# Patient Record
Sex: Female | Born: 1960 | Race: White | Hispanic: No | Marital: Married | Smoking: Never smoker
Health system: Southern US, Community
[De-identification: ages and names within clinical notes are randomized; demographics above are authoritative.]

## PROBLEM LIST (undated history)

## (undated) DIAGNOSIS — Z9889 Other specified postprocedural states: Secondary | ICD-10-CM

## (undated) DIAGNOSIS — C50919 Malignant neoplasm of unspecified site of unspecified female breast: Secondary | ICD-10-CM

## (undated) DIAGNOSIS — E785 Hyperlipidemia, unspecified: Secondary | ICD-10-CM

## (undated) DIAGNOSIS — R112 Nausea with vomiting, unspecified: Secondary | ICD-10-CM

## (undated) DIAGNOSIS — I10 Essential (primary) hypertension: Secondary | ICD-10-CM

## (undated) DIAGNOSIS — D0500 Lobular carcinoma in situ of unspecified breast: Secondary | ICD-10-CM

## (undated) DIAGNOSIS — N6099 Unspecified benign mammary dysplasia of unspecified breast: Secondary | ICD-10-CM

## (undated) DIAGNOSIS — K635 Polyp of colon: Secondary | ICD-10-CM

## (undated) DIAGNOSIS — F419 Anxiety disorder, unspecified: Secondary | ICD-10-CM

## (undated) HISTORY — DX: Unspecified benign mammary dysplasia of unspecified breast: N60.99

## (undated) HISTORY — DX: Essential (primary) hypertension: I10

## (undated) HISTORY — PX: BREAST BIOPSY: SHX20

## (undated) HISTORY — PX: DILATION AND CURETTAGE OF UTERUS: SHX78

## (undated) HISTORY — PX: COLONOSCOPY W/ BIOPSIES AND POLYPECTOMY: SHX1376

## (undated) HISTORY — DX: Polyp of colon: K63.5

## (undated) HISTORY — DX: Hyperlipidemia, unspecified: E78.5

---

## 1987-09-29 HISTORY — PX: TUBAL LIGATION: SHX77

## 1992-09-28 HISTORY — PX: ABDOMINAL HYSTERECTOMY: SHX81

## 1998-09-28 HISTORY — PX: PAROTID GLAND TUMOR EXCISION: SHX5221

## 2006-09-28 HISTORY — PX: BREAST LUMPECTOMY: SHX2

## 2006-09-28 HISTORY — PX: BREAST BIOPSY: SHX20

## 2007-04-04 ENCOUNTER — Ambulatory Visit: Payer: Self-pay | Admitting: Family Medicine

## 2007-04-04 DIAGNOSIS — I10 Essential (primary) hypertension: Secondary | ICD-10-CM

## 2007-04-18 ENCOUNTER — Ambulatory Visit: Payer: Self-pay | Admitting: Family Medicine

## 2007-04-19 ENCOUNTER — Telehealth (INDEPENDENT_AMBULATORY_CARE_PROVIDER_SITE_OTHER): Payer: Self-pay | Admitting: *Deleted

## 2007-05-05 ENCOUNTER — Ambulatory Visit: Payer: Self-pay | Admitting: Family Medicine

## 2007-05-06 LAB — CONVERTED CEMR LAB
BUN: 12 mg/dL (ref 6–23)
Calcium: 9.6 mg/dL (ref 8.4–10.5)
Creatinine, Ser: 0.7 mg/dL (ref 0.4–1.2)
Sodium: 140 meq/L (ref 135–145)

## 2007-05-24 ENCOUNTER — Encounter: Payer: Self-pay | Admitting: Family Medicine

## 2007-06-10 ENCOUNTER — Encounter: Payer: Self-pay | Admitting: Family Medicine

## 2007-06-10 ENCOUNTER — Encounter: Admission: RE | Admit: 2007-06-10 | Discharge: 2007-06-10 | Payer: Self-pay | Admitting: Obstetrics and Gynecology

## 2007-06-29 ENCOUNTER — Encounter: Admission: RE | Admit: 2007-06-29 | Discharge: 2007-06-29 | Payer: Self-pay | Admitting: Obstetrics and Gynecology

## 2007-06-29 ENCOUNTER — Encounter: Payer: Self-pay | Admitting: Family Medicine

## 2007-06-29 ENCOUNTER — Encounter (INDEPENDENT_AMBULATORY_CARE_PROVIDER_SITE_OTHER): Payer: Self-pay | Admitting: Diagnostic Radiology

## 2007-08-05 ENCOUNTER — Ambulatory Visit: Payer: Self-pay | Admitting: Family Medicine

## 2007-08-05 DIAGNOSIS — E785 Hyperlipidemia, unspecified: Secondary | ICD-10-CM | POA: Insufficient documentation

## 2007-08-11 ENCOUNTER — Encounter (INDEPENDENT_AMBULATORY_CARE_PROVIDER_SITE_OTHER): Payer: Self-pay | Admitting: General Surgery

## 2007-08-11 ENCOUNTER — Encounter: Admission: RE | Admit: 2007-08-11 | Discharge: 2007-08-11 | Payer: Self-pay | Admitting: General Surgery

## 2007-08-11 ENCOUNTER — Ambulatory Visit (HOSPITAL_BASED_OUTPATIENT_CLINIC_OR_DEPARTMENT_OTHER): Admission: RE | Admit: 2007-08-11 | Discharge: 2007-08-11 | Payer: Self-pay | Admitting: General Surgery

## 2007-11-01 ENCOUNTER — Ambulatory Visit: Payer: Self-pay | Admitting: Family Medicine

## 2007-11-07 ENCOUNTER — Emergency Department (HOSPITAL_COMMUNITY): Admission: EM | Admit: 2007-11-07 | Discharge: 2007-11-07 | Payer: Self-pay | Admitting: Family Medicine

## 2007-11-07 ENCOUNTER — Telehealth (INDEPENDENT_AMBULATORY_CARE_PROVIDER_SITE_OTHER): Payer: Self-pay | Admitting: *Deleted

## 2007-11-07 LAB — CONVERTED CEMR LAB
ALT: 21 units/L (ref 0–35)
AST: 21 units/L (ref 0–37)
BUN: 15 mg/dL (ref 6–23)
CO2: 29 meq/L (ref 19–32)
Calcium: 9.3 mg/dL (ref 8.4–10.5)
Creatinine, Ser: 0.9 mg/dL (ref 0.4–1.2)
GFR calc non Af Amer: 71 mL/min
Potassium: 3.4 meq/L — ABNORMAL LOW (ref 3.5–5.1)
Sodium: 141 meq/L (ref 135–145)
Total CHOL/HDL Ratio: 4.9
Total Protein: 7.1 g/dL (ref 6.0–8.3)
Triglycerides: 65 mg/dL (ref 0–149)

## 2007-11-17 ENCOUNTER — Ambulatory Visit: Payer: Self-pay | Admitting: Family Medicine

## 2007-11-17 DIAGNOSIS — S60459A Superficial foreign body of unspecified finger, initial encounter: Secondary | ICD-10-CM | POA: Insufficient documentation

## 2008-02-01 ENCOUNTER — Encounter: Payer: Self-pay | Admitting: Family Medicine

## 2008-02-06 ENCOUNTER — Ambulatory Visit: Payer: Self-pay | Admitting: Family Medicine

## 2008-02-12 LAB — CONVERTED CEMR LAB
AST: 22 units/L (ref 0–37)
Albumin: 3.8 g/dL (ref 3.5–5.2)
Alkaline Phosphatase: 54 units/L (ref 39–117)
Cholesterol: 191 mg/dL (ref 0–200)
HDL: 37.5 mg/dL — ABNORMAL LOW (ref >39.0)
LDL Cholesterol: 142 mg/dL — ABNORMAL HIGH (ref 0–99)
Total Bilirubin: 0.6 mg/dL (ref 0.3–1.2)
Total CHOL/HDL Ratio: 5.1
Triglycerides: 60 mg/dL (ref 0–149)
VLDL: 12 mg/dL (ref 0–40)

## 2008-02-13 ENCOUNTER — Encounter (INDEPENDENT_AMBULATORY_CARE_PROVIDER_SITE_OTHER): Payer: Self-pay | Admitting: *Deleted

## 2008-03-01 ENCOUNTER — Telehealth (INDEPENDENT_AMBULATORY_CARE_PROVIDER_SITE_OTHER): Payer: Self-pay | Admitting: *Deleted

## 2008-04-04 ENCOUNTER — Encounter: Admission: RE | Admit: 2008-04-04 | Discharge: 2008-04-04 | Payer: Self-pay | Admitting: Obstetrics and Gynecology

## 2008-04-06 ENCOUNTER — Ambulatory Visit: Payer: Self-pay | Admitting: Family Medicine

## 2008-06-12 ENCOUNTER — Ambulatory Visit: Payer: Self-pay | Admitting: Family Medicine

## 2008-06-13 LAB — CONVERTED CEMR LAB
AST: 22 units/L (ref 0–37)
Albumin: 4 g/dL (ref 3.5–5.2)
Alkaline Phosphatase: 48 units/L (ref 39–117)
Bilirubin, Direct: 0.2 mg/dL (ref 0.0–0.3)
Chloride: 110 meq/L (ref 96–112)
Creatinine, Ser: 0.8 mg/dL (ref 0.4–1.2)
GFR calc Af Amer: 98 mL/min
GFR calc non Af Amer: 81 mL/min
Total Bilirubin: 0.9 mg/dL (ref 0.3–1.2)

## 2008-06-14 ENCOUNTER — Encounter (INDEPENDENT_AMBULATORY_CARE_PROVIDER_SITE_OTHER): Payer: Self-pay | Admitting: *Deleted

## 2008-06-25 ENCOUNTER — Telehealth (INDEPENDENT_AMBULATORY_CARE_PROVIDER_SITE_OTHER): Payer: Self-pay | Admitting: *Deleted

## 2008-07-03 ENCOUNTER — Ambulatory Visit: Payer: Self-pay | Admitting: Family Medicine

## 2008-11-20 ENCOUNTER — Ambulatory Visit: Payer: Self-pay | Admitting: Family Medicine

## 2008-11-21 LAB — CONVERTED CEMR LAB
ALT: 27 units/L (ref 0–35)
AST: 26 units/L (ref 0–37)
Albumin: 3.7 g/dL (ref 3.5–5.2)
Alkaline Phosphatase: 44 units/L (ref 39–117)
Bilirubin, Direct: 0.1 mg/dL (ref 0.0–0.3)
Cholesterol: 145 mg/dL (ref 0–200)
HDL: 45.6 mg/dL (ref >39.0)
LDL Cholesterol: 87 mg/dL (ref 0–99)
Total Bilirubin: 0.6 mg/dL (ref 0.3–1.2)
Total CHOL/HDL Ratio: 3.2
Total Protein: 7.2 g/dL (ref 6.0–8.3)
Triglycerides: 63 mg/dL (ref 0–149)
VLDL: 13 mg/dL (ref 0–40)

## 2008-11-22 ENCOUNTER — Encounter (INDEPENDENT_AMBULATORY_CARE_PROVIDER_SITE_OTHER): Payer: Self-pay | Admitting: *Deleted

## 2009-04-09 ENCOUNTER — Encounter: Admission: RE | Admit: 2009-04-09 | Discharge: 2009-04-09 | Payer: Self-pay | Admitting: Obstetrics and Gynecology

## 2009-07-04 ENCOUNTER — Ambulatory Visit: Payer: Self-pay | Admitting: Family Medicine

## 2009-07-09 ENCOUNTER — Telehealth: Payer: Self-pay | Admitting: Family Medicine

## 2009-07-09 LAB — CONVERTED CEMR LAB
ALT: 37 U/L — ABNORMAL HIGH
AST: 32 U/L
Albumin: 3.9 g/dL
Alkaline Phosphatase: 42 U/L
Bilirubin, Direct: 0 mg/dL
Cholesterol: 173 mg/dL
HDL: 43.1 mg/dL
LDL Cholesterol: 110 mg/dL — ABNORMAL HIGH
Total Bilirubin: 0.6 mg/dL
Total CHOL/HDL Ratio: 4
Total Protein: 7.4 g/dL
Triglycerides: 102 mg/dL
VLDL: 20.4 mg/dL

## 2009-07-22 ENCOUNTER — Ambulatory Visit: Payer: Self-pay | Admitting: Family Medicine

## 2009-07-22 DIAGNOSIS — IMO0001 Reserved for inherently not codable concepts without codable children: Secondary | ICD-10-CM

## 2009-07-22 DIAGNOSIS — F4320 Adjustment disorder, unspecified: Secondary | ICD-10-CM | POA: Insufficient documentation

## 2009-07-22 DIAGNOSIS — D1800 Hemangioma unspecified site: Secondary | ICD-10-CM | POA: Insufficient documentation

## 2009-07-23 ENCOUNTER — Ambulatory Visit: Payer: Self-pay | Admitting: Family Medicine

## 2009-07-23 ENCOUNTER — Telehealth (INDEPENDENT_AMBULATORY_CARE_PROVIDER_SITE_OTHER): Payer: Self-pay | Admitting: *Deleted

## 2009-11-08 ENCOUNTER — Telehealth (INDEPENDENT_AMBULATORY_CARE_PROVIDER_SITE_OTHER): Payer: Self-pay | Admitting: *Deleted

## 2009-12-27 ENCOUNTER — Ambulatory Visit: Payer: Self-pay | Admitting: Family

## 2009-12-27 DIAGNOSIS — R05 Cough: Secondary | ICD-10-CM

## 2009-12-27 DIAGNOSIS — J40 Bronchitis, not specified as acute or chronic: Secondary | ICD-10-CM | POA: Insufficient documentation

## 2009-12-30 ENCOUNTER — Telehealth: Payer: Self-pay | Admitting: Family

## 2009-12-30 ENCOUNTER — Ambulatory Visit: Payer: Self-pay | Admitting: Diagnostic Radiology

## 2009-12-30 ENCOUNTER — Ambulatory Visit (HOSPITAL_BASED_OUTPATIENT_CLINIC_OR_DEPARTMENT_OTHER): Admission: RE | Admit: 2009-12-30 | Discharge: 2009-12-30 | Payer: Self-pay | Admitting: Internal Medicine

## 2009-12-30 ENCOUNTER — Ambulatory Visit: Payer: Self-pay | Admitting: Family

## 2010-03-28 ENCOUNTER — Telehealth (INDEPENDENT_AMBULATORY_CARE_PROVIDER_SITE_OTHER): Payer: Self-pay | Admitting: *Deleted

## 2010-04-03 ENCOUNTER — Telehealth (INDEPENDENT_AMBULATORY_CARE_PROVIDER_SITE_OTHER): Payer: Self-pay | Admitting: *Deleted

## 2010-04-10 ENCOUNTER — Encounter: Admission: RE | Admit: 2010-04-10 | Discharge: 2010-04-10 | Payer: Self-pay | Admitting: Family Medicine

## 2010-05-27 ENCOUNTER — Ambulatory Visit: Payer: Self-pay | Admitting: Family Medicine

## 2010-05-28 LAB — CONVERTED CEMR LAB
Albumin: 4.1 g/dL (ref 3.5–5.2)
Alkaline Phosphatase: 53 units/L (ref 39–117)
Total Protein: 7.2 g/dL (ref 6.0–8.3)

## 2010-06-13 ENCOUNTER — Ambulatory Visit: Payer: Self-pay | Admitting: Family Medicine

## 2010-09-05 ENCOUNTER — Encounter: Payer: Self-pay | Admitting: Family Medicine

## 2010-09-05 ENCOUNTER — Ambulatory Visit: Payer: Self-pay | Admitting: Family Medicine

## 2010-10-20 ENCOUNTER — Encounter: Payer: Self-pay | Admitting: Family Medicine

## 2010-10-29 NOTE — Progress Notes (Signed)
Summary: CRESTOR RX  Phone Note Call from Patient   Caller: Patient Summary of Call: PT STATES SHE WENT TO THE DRUG STORE YESTERDAY TO PICK UP HER RX FOR CRESTOR 5 MG. THEY ARE TRYING TO CHARGE HER FOR 2 MONTHS BUT ITS ONLY 30 PILLS.SHE STATES SHE UNDERSTANDS THAT SHE ONLY TAKES IT EVERY OTHER DAY BUT SHE BELIEVES SHE SHOULD ONLY PAY ONE CO-PAY. SHE DOESNT KNOW IF IT COULD BE CALLED IN DIFFERENTLY TO KEEP HER FROM HAVING TO PAY 2 CO-PAYS. PLEASE ADVISE. PT CAN BE REACHED AT [phone]. Initial call taken by: Lavell Islam,  April 03, 2010 9:20 AM  Follow-up for Phone Call        pt advise rx sent in with direction change but still need to take med every other day.Felecia Deloach CMA  April 03, 2010 10:45 AM     New/Updated Medications: CRESTOR 5 MG TABS (ROSUVASTATIN CALCIUM) Take 1 by mouth as directed Prescriptions: CRESTOR 5 MG TABS (ROSUVASTATIN CALCIUM) Take 1 by mouth as directed  #30 x 0   Entered by:   Jeremy Johann CMA   Authorized by:   Loreen Freud DO   Signed by:   Jeremy Johann CMA on 04/03/2010   Method used:   Re-Faxed to ...       [id number] (retail)       [address]       [place], [place]  [address]       Ph: [phone] or [phone]       Fax: [phone]   RxID:   [id number]

## 2010-10-29 NOTE — Progress Notes (Signed)
  Phone Note Refill Request Message from:  Pharmacy [facility]  [phone]  Refills Requested: Medication #1:  NORVASC 10 MG  TABS 1 by mouth once daily  Medication #2:  HYDROCHLOROTHIAZIDE 25 MG  TABS 1 by mouth once daily Initial call taken by: Kandice Hams,  November 08, 2009 2:34 PM    Prescriptions: HYDROCHLOROTHIAZIDE 25 MG  TABS (HYDROCHLOROTHIAZIDE) 1 by mouth once daily  #90 x 0   Entered by:   Kandice Hams   Authorized by:   Loreen Freud DO   Signed by:   Kandice Hams on 11/08/2009   Method used:   Faxed to ...       [facility]* (mail-order)             ,          Ph: [phone]       Fax: [phone]   RxID:   [id number] NORVASC 10 MG  TABS (AMLODIPINE BESYLATE) 1 by mouth once daily  #90 x 0   Entered by:   Kandice Hams   Authorized by:   Loreen Freud DO   Signed by:   Kandice Hams on 11/08/2009   Method used:   Faxed to ...       [facility]* (mail-order)             ,          Ph: [phone]       Fax: [phone]   RxID:   [id number]

## 2010-10-29 NOTE — Progress Notes (Signed)
Summary: letter mailed needs fasting lab appr  Phone Note Outgoing Call   Reason for Call: Discuss lab or test results Summary of Call: Pt needs to come in for lab appt:  NMR, hep  272.4  Follow-up for Phone Call        letter mailed to patient to schedule fasting lab appt .Marland KitchenOkey Regal Spring  April 04, 2010 3:14 PM

## 2010-10-29 NOTE — Progress Notes (Signed)
Summary: SEEN FRIDAY NO BETTER--appt.  Phone Note Call from Patient Call back at Home Phone [phone]   Caller: Patient Summary of Call: pt called was seen friday 12/27/09 was told to call if no better. Pt says cough is no better, Tessalon Perles did not do anything, cough is worse in afternoon and at night.  Runny nose, has 1 more day of zpak,  broke out in sweat last night, denies fever today.  [facility]  Initial call taken by: Kandice Hams,  December 30, 2009 10:00 AM  Follow-up for Phone Call        Patient needs to be seen in office.  She will need a chest x-ray.  If Dr. Laury Axon cannot see her than I can see her in [place].  Thanks Follow-up by: Lemont Fillers FNP,  December 30, 2009 10:20 AM  Additional Follow-up for Phone Call Additional follow up Details #1::        Spoke  with pt. and scheduled follow up today at 1:15pm with Bravery Ketcham.  Mervin Kung CMA  December 30, 2009 10:41 AM

## 2010-10-29 NOTE — Assessment & Plan Note (Signed)
Summary: COUGH NOT BETTER/ TF,CMA   Vital Signs:  Patient profile:   50 year old female Height:      65.5 inches Weight:      165 pounds BMI:     27.14 O2 Sat:      98 % on Room air Temp:     97.9 degrees F oral Pulse rate:   84 / minute Pulse rhythm:   regular Resp:     16 per minute BP sitting:   110 / 86  (right arm) Cuff size:   regular  Vitals Entered By: Mervin Kung CMA (December 30, 2009 1:18 PM)  O2 Flow:  Room air CC: room 4   Cough not better. Feels worse.    Primary Care Provider:  lowne  CC:  room 4   Cough not better. Feels worse. Marland Kitchen  History of Present Illness: Kathy Watkins is a 50 year old female who presents today in follow up of her bronchitis.  She was seen 4/1 and prescribed zithromax and tessalon.  Notes that she has had some chills, cough is worse despite tessalon and has been associated with some stress incontinence.  Yesterday she started to develop some clear nasal congestion,  cough is generally dry.  Denies fever, sore throat or ear pain.  She has one more day left of her zithromax.   Allergies (verified): No Known Drug Allergies  Physical Exam  General:  Well-developed,well-nourished,in no acute distress; alert,appropriate and cooperative throughout examination.  Notes to have hacking cough frequently throughout examination/interview Head:  Normocephalic and atraumatic without obvious abnormalities. No apparent alopecia or balding. Ears:  External ear exam shows no significant lesions or deformities.  Otoscopic examination reveals clear canals, tympanic membranes are intact bilaterally without bulging, retraction, inflammation or discharge. Hearing is grossly normal bilaterally. Mouth:  Oral mucosa and oropharynx without lesions or exudates.  Teeth in good repair. Neck:  No deformities, masses, or tenderness noted. Lungs:  Normal respiratory effort, chest expands symmetrically. Lungs are clear to auscultation, no crackles or wheezes. Heart:  Normal  rate and regular rhythm. S1 and S2 normal without gallop, murmur, click, rub or other extra sounds.   Impression & Recommendations:  Problem # 1:  BRONCHITIS, MILD (ICD-490) CXR performed today and is negative. Patient instructed to complete zithromax. Will give patient a trial of Advair for possible Reactive airway disease.  Pt also given prescription for tussionex to be used at bedtime.  Plan follow up in 1-2 weeks.  pt given samples of advair lot [id number] exp 03/2011 Her updated medication list for this problem includes:    Zithromax Z-pak 250 Mg Tabs (Azithromycin) .Marland Kitchen... 2 tabs by mouth today, then one tablet by mouth daily x 4 more days    Tessalon Perles 100 Mg Caps (Benzonatate) ..... One cap by mouth three times a day as needed cough    Tussionex Pennkinetic Er 8-10 Mg/78ml Lqcr (Chlorpheniramine-hydrocodone) .Marland KitchenMarland KitchenMarland KitchenMarland Kitchen 5 ml by mouth every 12 hours as needed for cough  Orders: CXR- 2view (CXR)  Complete Medication List: 1)  Norvasc 10 Mg Tabs (Amlodipine besylate) .Marland Kitchen.. 1 by mouth once daily 2)  Hydrochlorothiazide 25 Mg Tabs (Hydrochlorothiazide) .Marland Kitchen.. 1 by mouth once daily 3)  Alprazolam 0.25 Mg Tabs (Alprazolam) .Marland Kitchen.. 1 by mouth three times a day as needed 4)  Crestor 5 Mg Tabs (Rosuvastatin calcium) .Marland Kitchen.. 1 by mouth every other day. 5)  Fish Oil 1000 Mg Caps (Omega-3 fatty acids) .... Take 1 capsule by mouth two times a day  6)  Daily Multi Tabs (Multiple vitamins-minerals) .... Take 1 tablet by mouth once a day 7)  Zithromax Z-pak 250 Mg Tabs (Azithromycin) .... 2 tabs by mouth today, then one tablet by mouth daily x 4 more days 8)  Tessalon Perles 100 Mg Caps (Benzonatate) .... One cap by mouth three times a day as needed cough 9)  Tussionex Pennkinetic Er 8-10 Mg/71ml Lqcr (Chlorpheniramine-hydrocodone) .... 5 ml by mouth every 12 hours as needed for cough  Patient Instructions: 1)  Please follow up in 1-2 weeks, call if symptoms worsen or do not improve. Prescriptions: TUSSIONEX  PENNKINETIC ER 8-10 MG/5ML LQCR (CHLORPHENIRAMINE-HYDROCODONE) 5 ml by mouth every 12 hours as needed for cough  #150 x 0   Entered and Authorized by:   Lemont Fillers FNP   Signed by:   Lemont Fillers FNP on 12/30/2009   Method used:   Print then Give to Patient   RxID:   [id number]   Current Allergies (reviewed today): No known allergies

## 2010-10-29 NOTE — Assessment & Plan Note (Signed)
Summary: "barking" cough//lch   Vital Signs:  Patient profile:   50 year old female Height:      65.5 inches Weight:      168 pounds O2 Sat:      97 % on Room air Temp:     98.9 degrees F oral Pulse rate:   74 / minute Pulse rhythm:   regular Resp:     18 per minute BP sitting:   136 / 78  (left arm) Cuff size:   regular  Vitals Entered By: Mervin Kung CMA (December 27, 2009 9:36 AM)  O2 Flow:  Room air CC: room 17  Cough x 2 days.   Primary Care Provider:  lowne  CC:  room 17  Cough x 2 days.Marland Kitchen  History of Present Illness: Kathy Watkins is a 50 year old female who presents with c/o 2 day history of dry cough.   Cough is worse with movement.  Has not used and OTC meds.  Denies nasal congestion,fever, ear pain, or sore throat.    Allergies (verified): No Known Drug Allergies  Physical Exam  General:  Well-developed,well-nourished,in no acute distress; alert,appropriate and cooperative throughout examination Eyes:  PERRLA Ears:  External ear exam shows no significant lesions or deformities.  Otoscopic examination reveals clear canals, tympanic membranes are intact bilaterally without bulging, retraction, inflammation or discharge. Hearing is grossly normal bilaterally. Lungs:  Normal respiratory effort, chest expands symmetrically. Lungs are clear to auscultation, no crackles or wheezes. Heart:  Normal rate and regular rhythm. S1 and S2 normal without gallop, murmur, click, rub or other extra sounds.   Impression & Recommendations:  Problem # 1:  BRONCHITIS, MILD (ICD-490) Assessment New Will treat with z-pak and tessalon as needed.  Pt instructed to f/u as listed on pt instructions Her updated medication list for this problem includes:    Zithromax Z-pak 250 Mg Tabs (Azithromycin) .Marland Kitchen... 2 tabs by mouth today, then one tablet by mouth daily x 4 more days    Tessalon Perles 100 Mg Caps (Benzonatate) ..... One cap by mouth three times a day as needed cough  Complete  Medication List: 1)  Norvasc 10 Mg Tabs (Amlodipine besylate) .Marland Kitchen.. 1 by mouth once daily 2)  Hydrochlorothiazide 25 Mg Tabs (Hydrochlorothiazide) .Marland Kitchen.. 1 by mouth once daily 3)  Alprazolam 0.25 Mg Tabs (Alprazolam) .Marland Kitchen.. 1 by mouth three times a day as needed 4)  Crestor 5 Mg Tabs (Rosuvastatin calcium) .Marland Kitchen.. 1 by mouth every other day. 5)  Fish Oil 1000 Mg Caps (Omega-3 fatty acids) .... Take 1 capsule by mouth two times a day 6)  Daily Multi Tabs (Multiple vitamins-minerals) .... Take 1 tablet by mouth once a day 7)  Zithromax Z-pak 250 Mg Tabs (Azithromycin) .... 2 tabs by mouth today, then one tablet by mouth daily x 4 more days 8)  Tessalon Perles 100 Mg Caps (Benzonatate) .... One cap by mouth three times a day as needed cough  Patient Instructions: 1)  Call if fever, symptoms worsen, or if your symptoms do not improve.  Prescriptions: TESSALON PERLES 100 MG CAPS (BENZONATATE) one cap by mouth three times a day as needed cough  #30 x 0   Entered and Authorized by:   Lemont Fillers FNP   Signed by:   Lemont Fillers FNP on 12/27/2009   Method used:   Electronically to        [id number]       [address]  [place], [place]  [address]       Ph: [phone] or [phone]       Fax: [phone]   RxID:   [id number] ZITHROMAX Z-PAK 250 MG TABS (AZITHROMYCIN) 2 tabs by mouth today, then one tablet by mouth daily x 4 more days  #1 pack x 0   Entered and Authorized by:   Lemont Fillers FNP   Signed by:   Lemont Fillers FNP on 12/27/2009   Method used:   Electronically to        [id number] ([facility]ail)       [address]       [place], [place]  [address]       Ph: [phone] or [phone]       Fax: [phone]   RxID:   [id number]   Current Allergies (reviewed today): No known allergies

## 2010-10-29 NOTE — Assessment & Plan Note (Signed)
Summary: follow up labs/cbs   Vital Signs:  Patient profile:   50 year old female Weight:      158.8 pounds Temp:     98.9 degrees F oral Pulse rate:   84 / minute Pulse rhythm:   regular BP sitting:   116 / 76  (left arm)  Vitals Entered By: Almeta Monas CMA Duncan Dull) (June 13, 2010 8:23 AM)   Flu Vaccine Consent Questions     Do you have a history of severe allergic reactions to this vaccine? no    Any prior history of allergic reactions to egg and/or gelatin? no    Do you have a sensitivity to the preservative Thimersol? no    Do you have a past history of Guillan-Barre Syndrome? no    Do you currently have an acute febrile illness? no    Have you ever had a severe reaction to latex? no    Vaccine information given and explained to patient? yes    Are you currently pregnant? no    Lot Number:[id number]   Exp Date:03/28/2011   Site Given  Left Deltoid IM                                CC: pt here to discuss labs   History of Present Illness: Pt is here to review labs only.  No complaints.  Current Medications (verified): 1)  Norvasc 10 Mg  Tabs (Amlodipine Besylate) .Marland Kitchen.. 1 By Mouth Once Daily 2)  Hydrochlorothiazide 25 Mg  Tabs (Hydrochlorothiazide) .Marland Kitchen.. 1 By Mouth Once Daily 3)  Alprazolam 0.25 Mg Tabs (Alprazolam) .Marland Kitchen.. 1 By Mouth Three Times A Day As Needed 4)  Crestor 5 Mg Tabs (Rosuvastatin Calcium) .... Take 1 By Mouth As Directed 5)  Fish Oil 1000 Mg Caps (Omega-3 Fatty Acids) .... Take 2 Capsules By Mouth Two Times A Day 6)  Daily Multi  Tabs (Multiple Vitamins-Minerals) .... Take 1 Tablet By Mouth Once A Day  Allergies (verified): No Known Drug Allergies  Past History:  Past medical, surgical, family and social histories (including risk factors) reviewed for relevance to current acute and chronic problems.  Past Medical History: Reviewed history from 08/05/2007 and no changes required. Hypertension Hyperlipidemia  Past  Surgical History: Reviewed history from 04/04/2007 and no changes required. Hysterectomy-1994 (STILL WITH OVARIES)  Family History: Reviewed history from 04/04/2007 and no changes required. Family History of Prostate CA 1st degree relative <50-FATHER (LIVING) Family History Breast cancer 1st degree relative <50-MOTHER (DECEASE) OTHER CANCER-MGM, PGM Family History Hypertension-M,F  1 SIBLING-SISTER (LIVING)  Social History: Reviewed history from 04/04/2007 and no changes required. Occupation: Librarian, academic Married Never Smoked Alcohol use-yes Drug use-no Regular exercise-yes  Review of Systems      See HPI  Physical Exam  General:  Well-developed,well-nourished,in no acute distress; alert,appropriate and cooperative throughout examination Psych:  Cognition and judgment appear intact. Alert and cooperative with normal attention span and concentration. No apparent delusions, illusions, hallucinations   Impression & Recommendations:  Problem # 1:  HYPERLIPIDEMIA (ICD-272.4) NMR reviewed with pt increase fish oil to 1000mg   2 by mouth two times a day  Her updated medication list for this problem includes:    Crestor 5 Mg Tabs (Rosuvastatin calcium) .Marland Kitchen... Take 1 by mouth as directed  Labs Reviewed: SGOT: 20 (05/27/2010)   SGPT: 20 (05/27/2010)   HDL:43.10 (07/04/2009), 45.6 (11/20/2008)  LDL:110 (07/04/2009), 87 (16/06/9603)  Chol:173 (07/04/2009), 145 (11/20/2008)  Trig:102.0 (07/04/2009), 63 (11/20/2008)  Complete Medication List: 1)  Norvasc 10 Mg Tabs (Amlodipine besylate) .Marland Kitchen.. 1 by mouth once daily 2)  Hydrochlorothiazide 25 Mg Tabs (Hydrochlorothiazide) .Marland Kitchen.. 1 by mouth once daily 3)  Alprazolam 0.25 Mg Tabs (Alprazolam) .Marland Kitchen.. 1 by mouth three times a day as needed 4)  Crestor 5 Mg Tabs (Rosuvastatin calcium) .... Take 1 by mouth as directed 5)  Fish Oil 1000 Mg Caps (Omega-3 fatty acids) .... Take 2 capsules by mouth two times a day 6)  Daily Multi Tabs (Multiple  vitamins-minerals) .... Take 1 tablet by mouth once a day  Other Orders: Admin 1st Vaccine ([address]6440) Flu Vaccine 3yrs + ([address]42)  Patient Instructions: 1)  fasting labs 3 months-----272.4  NMR, hep

## 2010-10-30 LAB — CONVERTED CEMR LAB
ALT: 18 units/L (ref 0–35)
AST: 21 units/L (ref 0–37)
Alkaline Phosphatase: 59 units/L (ref 39–117)
Bilirubin, Direct: 0.1 mg/dL (ref 0.0–0.3)
Total Protein: 7.1 g/dL (ref 6.0–8.3)

## 2010-11-05 NOTE — Procedures (Signed)
Summary: Colonoscopy/[facility]  Colonoscopy/[facility]   Imported By: Lanelle Bal 10/29/2010 11:08:33  _____________________________________________________________________  External Attachment:    Type:   Image     Comment:   External Document

## 2010-12-02 ENCOUNTER — Telehealth: Payer: Self-pay | Admitting: Family Medicine

## 2010-12-09 NOTE — Progress Notes (Signed)
Summary: refill  Phone Note Refill Request Message from:  Fax from Pharmacy on December 02, 2010 2:07 PM  Refills Requested: Medication #1:  ALPRAZOLAM 0.25 MG TABS 1 by mouth three times a day as needed   [facility]: [facility] [address] [facility] [id number]  Initial call taken by: Okey Regal Spring,  December 02, 2010 2:08 PM  Follow-up for Phone Call        last seen 06/07/10 and filled 07/22/09. please advise Follow-up by: Almeta Monas CMA Duncan Dull),  December 02, 2010 2:54 PM  Additional Follow-up for Phone Call Additional follow up Details #1::        refill x1 Additional Follow-up by: Loreen Freud DO,  December 02, 2010 3:49 PM    Additional Follow-up for Phone Call Additional follow up Details #2::    [facility] [address]... Almeta Monas CMA Duncan Dull)  December 02, 2010 3:56 PM   Prescriptions: ALPRAZOLAM 0.25 MG TABS (ALPRAZOLAM) 1 by mouth three times a day as needed  #60 x 0   Entered by:   Almeta Monas CMA (AAMA)   Authorized by:   Loreen Freud DO   Signed by:   Almeta Monas CMA (AAMA) on 12/02/2010   Method used:   Printed then faxed to ...       [facility]* (retail)             ,          Ph: [phone]       Fax: [phone]   RxID:   [id number]

## 2010-12-15 ENCOUNTER — Ambulatory Visit (INDEPENDENT_AMBULATORY_CARE_PROVIDER_SITE_OTHER): Payer: BC Managed Care – PPO | Admitting: Family Medicine

## 2010-12-15 ENCOUNTER — Encounter: Payer: Self-pay | Admitting: Family Medicine

## 2010-12-15 DIAGNOSIS — S058X9A Other injuries of unspecified eye and orbit, initial encounter: Secondary | ICD-10-CM

## 2010-12-25 NOTE — Assessment & Plan Note (Signed)
Summary: eye red/cbs   Vital Signs:  Patient profile:   50 year old female Height:      65.5 inches Weight:      165.0 pounds BMI:     27.14 Temp:     98.2 degrees F oral BP sitting:   126 / 74  (left arm) Cuff size:   regular  Vitals Entered By: Almeta Monas CMA Duncan Dull) (December 15, 2010 9:48 AM) CC: woke up this morning feeling like something was bothering her eyes, Hypertension Management   History of Present Illness: Pt here c/o fb sensation in both eyes.  She woke up that way this am.  + some mild nasal congestion.  Pt was working in yard over weekend.  Hypertension History:      Positive major cardiovascular risk factors include hyperlipidemia and hypertension.  Negative major cardiovascular risk factors include female age less than 53 years old, negative family history for ischemic heart disease, and non-tobacco-user status.     Current Medications (verified): 1)  Norvasc 10 Mg  Tabs (Amlodipine Besylate) .Marland Kitchen.. 1 By Mouth Once Daily 2)  Hydrochlorothiazide 25 Mg  Tabs (Hydrochlorothiazide) .Marland Kitchen.. 1 By Mouth Once Daily 3)  Alprazolam 0.25 Mg Tabs (Alprazolam) .Marland Kitchen.. 1 By Mouth Three Times A Day As Needed 4)  Crestor 5 Mg Tabs (Rosuvastatin Calcium) .... Take 1 By Mouth As Directed 5)  Fish Oil 1000 Mg Caps (Omega-3 Fatty Acids) .... Take 2 Capsules By Mouth Two Times A Day 6)  Vigamox 0.5 % Soln (Moxifloxacin Hcl) .Marland Kitchen.. 1 Gtt Three Times A Day Both Eyes  Allergies (verified): No Known Drug Allergies  Past History:  Past medical, surgical, family and social histories (including risk factors) reviewed for relevance to current acute and chronic problems.  Past Medical History: Reviewed history from 08/05/2007 and no changes required. Hypertension Hyperlipidemia  Past Surgical History: Reviewed history from 04/04/2007 and no changes required. Hysterectomy-1994 (STILL WITH OVARIES)  Family History: Reviewed history from 04/04/2007 and no changes required. Family History of  Prostate CA 1st degree relative <50-FATHER (LIVING) Family History Breast cancer 1st degree relative <50-MOTHER (DECEASE) OTHER CANCER-MGM, PGM Family History Hypertension-M,F  1 SIBLING-SISTER (LIVING)  Social History: Reviewed history from 04/04/2007 and no changes required. 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     Impression & Recommendations:  Problem # 1:  CORNEAL ABRASION (ICD-918.1) Assessment New vigamox for 7 days  cool compresses  Complete Medication List: 1)  Norvasc 10 Mg Tabs (Amlodipine besylate) .Marland Kitchen.. 1 by mouth once daily 2)  Hydrochlorothiazide 25 Mg Tabs (Hydrochlorothiazide) .Marland Kitchen.. 1 by mouth once daily 3)  Alprazolam 0.25 Mg Tabs (Alprazolam) .Marland Kitchen.. 1 by mouth three times a day as needed 4)  Crestor 5 Mg Tabs (Rosuvastatin calcium) .... Take 1 by mouth as directed 5)  Fish Oil 1000 Mg Caps (Omega-3 fatty acids) .... Take 2 capsules by mouth two times a day 6)  Vigamox 0.5 % Soln (Moxifloxacin hcl) .Marland Kitchen.. 1 gtt three times a day both eyes  Hypertension Assessment/Plan:      The patient's hypertensive risk group is category B: At least one risk factor (excluding diabetes) with no target organ  damage.  Her calculated 10 year risk of coronary heart disease is 9 %.  Today's blood pressure is 126/74.    Patient Instructions: 1)  use drops for 7 days 2)  if no better 2-3 days call Dr Hazle Quant 3)  cool compresses Prescriptions: VIGAMOX 0.5 % SOLN (MOXIFLOXACIN HCL) 1 gtt three times a day both eyes  #7 days x 0   Entered and Authorized by:   Loreen Freud DO    Signed by:   Loreen Freud DO on 12/15/2010   Method used:   Electronically to        [id number]       [address]       [place], [place]  [address]       Ph: [phone] or [phone]       Fax: [phone]   RxID:   [id number]    Orders Added: 1)  Est. Patient Level III [03474]

## 2011-01-12 ENCOUNTER — Other Ambulatory Visit: Payer: Self-pay

## 2011-02-10 NOTE — Op Note (Signed)
NAMETAELYR, JANTZ              ACCOUNT NO.:  [id number]   MEDICAL RECORD NO.:  [id number]          PATIENT TYPE:  AMB   LOCATION:  DSC                          FACILITY:  [facility]   PHYSICIAN:  Ollen Gross. Vernell Morgans, M.D. DATE OF BIRTH:  29-Oct-1960   DATE OF PROCEDURE:  08/11/2007  DATE OF DISCHARGE:                               OPERATIVE REPORT   PREOPERATIVE DIAGNOSIS:  Left breast atypical ductal hyperplasia.   POSTOPERATIVE DIAGNOSIS:  Left breast atypical ductal hyperplasia.   PROCEDURE PERFORMED:  Left breast needle localized lumpectomy.   SURGEON:  Ollen Gross. Vernell Morgans, M.D.   ANESTHESIA:  General via LMA.   DESCRIPTION OF PROCEDURE:  After informed consent was obtained, the  patient was brought to the operating room and placed in the supine  position on the operating table.  After adequate induction of general  anesthesia, the patient's left breast was prepped with Betadine and  draped in the usual sterile manner.  Earlier in the day the patient had  undergone a wire localization procedure and the wire was entering the  left breast medially, aimed toward the nipple.   A small transverse incision was made just medial to the wire.  This  incision was carried down through the skin and subcutaneous tissue  sharply with electrocautery.  Once into the breast tissue, the path of  the wire could be easily palpated.  A circular portion of breast tissue  around the path of the wire was excised sharply with the electrocautery.  Once it was removed, it was sent to radiology for a specimen radiograph  that showed the clip and the wire in the center of the tissue removed.  It was sent to pathology for further evaluation.  Hemostasis was  achieved using the Bovie electrocautery.  The wound was then infiltrated  with 0.25% Marcaine.  The deep layer of the wound was then closed with  interrupted 3-0 Vicryl stitches; and the skin was closed with a running  4-0 Monocryl subcuticular stitch.  A  Dermabond dressing was applied.  The patient tolerated the procedure well.  At the end of the case, all  needle, sponge and instrument counts were correct.  The patient was then  awakened and taken to recovery in stable condition.      Ollen Gross. Vernell Morgans, M.D.  Electronically Signed     PST/MEDQ  D:  08/11/2007  T:  08/11/2007  Job:  [id number]

## 2011-02-23 ENCOUNTER — Other Ambulatory Visit: Payer: Self-pay | Admitting: Family Medicine

## 2011-04-10 ENCOUNTER — Other Ambulatory Visit: Payer: Self-pay | Admitting: Obstetrics and Gynecology

## 2011-04-10 DIAGNOSIS — Z1231 Encounter for screening mammogram for malignant neoplasm of breast: Secondary | ICD-10-CM

## 2011-04-13 ENCOUNTER — Other Ambulatory Visit: Payer: Self-pay | Admitting: Family Medicine

## 2011-04-13 DIAGNOSIS — E785 Hyperlipidemia, unspecified: Secondary | ICD-10-CM

## 2011-04-14 ENCOUNTER — Other Ambulatory Visit (INDEPENDENT_AMBULATORY_CARE_PROVIDER_SITE_OTHER): Payer: BC Managed Care – PPO

## 2011-04-14 DIAGNOSIS — E785 Hyperlipidemia, unspecified: Secondary | ICD-10-CM

## 2011-04-14 LAB — LIPID PANEL
Cholesterol: 192 mg/dL (ref 0–200)
HDL: 57.4 mg/dL (ref >39.00)
Triglycerides: 71 mg/dL (ref 0.0–149.0)
VLDL: 14.2 mg/dL (ref 0.0–40.0)

## 2011-04-14 LAB — HEPATIC FUNCTION PANEL
ALT: 26 U/L (ref 0–35)
Albumin: 4.1 g/dL (ref 3.5–5.2)
Total Protein: 7.3 g/dL (ref 6.0–8.3)

## 2011-04-14 NOTE — Progress Notes (Signed)
Labs only

## 2011-04-17 ENCOUNTER — Other Ambulatory Visit: Payer: Self-pay | Admitting: Family Medicine

## 2011-04-17 MED ORDER — HYDROCHLOROTHIAZIDE 25 MG PO TABS: 25.0000 mg | ORAL_TABLET | Freq: Every day | ORAL | Status: DC

## 2011-04-17 MED ORDER — AMLODIPINE BESYLATE 10 MG PO TABS: 10.0000 mg | ORAL_TABLET | Freq: Every day | ORAL | Status: DC

## 2011-04-22 ENCOUNTER — Ambulatory Visit
Admission: RE | Admit: 2011-04-22 | Discharge: 2011-04-22 | Disposition: A | Discharge status: Home / Self Care | Payer: BC Managed Care – PPO | Source: Ambulatory Visit | Attending: Obstetrics and Gynecology | Admitting: Obstetrics and Gynecology

## 2011-04-22 DIAGNOSIS — Z1231 Encounter for screening mammogram for malignant neoplasm of breast: Secondary | ICD-10-CM

## 2011-05-01 ENCOUNTER — Other Ambulatory Visit: Payer: Self-pay | Admitting: Family Medicine

## 2011-07-07 LAB — BASIC METABOLIC PANEL
Calcium: 9.2
GFR calc Af Amer: >60
GFR calc non Af Amer: >60
Sodium: 136

## 2011-09-01 ENCOUNTER — Ambulatory Visit (INDEPENDENT_AMBULATORY_CARE_PROVIDER_SITE_OTHER): Payer: BC Managed Care – PPO

## 2011-09-01 DIAGNOSIS — Z23 Encounter for immunization: Secondary | ICD-10-CM

## 2011-11-12 ENCOUNTER — Ambulatory Visit (INDEPENDENT_AMBULATORY_CARE_PROVIDER_SITE_OTHER): Payer: BC Managed Care – PPO | Admitting: Family Medicine

## 2011-11-12 ENCOUNTER — Encounter: Payer: Self-pay | Admitting: Family Medicine

## 2011-11-12 VITALS — BP 120/82 | HR 88 | Temp 98.3°F | Wt 173.8 lb

## 2011-11-12 DIAGNOSIS — J40 Bronchitis, not specified as acute or chronic: Secondary | ICD-10-CM

## 2011-11-12 MED ORDER — MOMETASONE FURO-FORMOTEROL FUM 100-5 MCG/ACT IN AERO: 2.0000 | INHALATION_SPRAY | Freq: Two times a day (BID) | RESPIRATORY_TRACT | Status: DC

## 2011-11-12 MED ORDER — AZITHROMYCIN 250 MG PO TABS: ORAL_TABLET | ORAL | Status: AC

## 2011-11-12 NOTE — Progress Notes (Signed)
  Subjective:     Kathy Watkins is a 51 y.o. female here for evaluation of a cough. Onset of symptoms was 3 days ago. Symptoms have been gradually worsening since that time. The cough is dry and painful and is aggravated by exercise and reclining position. Associated symptoms include: chest pain, chills and fever. Patient does not have a history of asthma. Patient does not have a history of environmental allergens. Patient has not traveled recently. Patient does not have a history of smoking. Patient has not had a previous chest x-ray. Patient has not had a PPD done.  The following portions of the patient's history were reviewed and updated as appropriate: allergies, current medications, past family history, past medical history, past social history, past surgical history and problem list.  Review of Systems Pertinent items are noted in HPI.    Objective:    Oxygen saturation 96% on room air BP 120/82  Pulse 88  Temp(Src) 98.3 F (36.8 C) (Oral)  Wt 173 lb 12.8 oz (78.835 kg)  SpO2 96% General appearance: alert, cooperative, appears stated age and no distress Ears: normal TM's and external ear canals both ears Nose: Nares normal. Septum midline. Mucosa normal. No drainage or sinus tenderness. Throat: lips, mucosa, and tongue normal; teeth and gums normal Neck: no adenopathy, no carotid bruit, no JVD, supple, symmetrical, trachea midline and thyroid not enlarged, symmetric, no tenderness/mass/nodules Lungs: diminished breath sounds bilaterally Heart: S1, S2 normal Lymph nodes: Cervical, supraclavicular, and axillary nodes normal.    Assessment:    Acute Bronchitis    Plan:    Antibiotics per medication orders. Antitussives per medication orders. Avoid exposure to tobacco smoke and fumes. B-agonist inhaler. Call if shortness of breath worsens, blood in sputum, change in character of cough, development of fever or chills, inability to maintain nutrition and hydration. Avoid  exposure to tobacco smoke and fumes.

## 2011-11-12 NOTE — Patient Instructions (Signed)

## 2011-11-17 ENCOUNTER — Telehealth: Payer: Self-pay | Admitting: Family Medicine

## 2011-11-17 ENCOUNTER — Encounter: Payer: Self-pay | Admitting: Internal Medicine

## 2011-11-17 ENCOUNTER — Ambulatory Visit (INDEPENDENT_AMBULATORY_CARE_PROVIDER_SITE_OTHER): Payer: BC Managed Care – PPO | Admitting: Internal Medicine

## 2011-11-17 VITALS — BP 118/80 | HR 88 | Temp 98.4°F | Wt 173.0 lb

## 2011-11-17 DIAGNOSIS — J40 Bronchitis, not specified as acute or chronic: Secondary | ICD-10-CM

## 2011-11-17 MED ORDER — HYDROCOD POLST-CHLORPHEN POLST 10-8 MG/5ML PO LQCR: 5.0000 mL | Freq: Two times a day (BID) | ORAL | Status: DC | PRN

## 2011-11-17 NOTE — Telephone Encounter (Signed)
Call resolved. Patient came in and saw Dr.Paz and got something for the cough, she stated she will try the cough medication before she does the CXR.    KP

## 2011-11-17 NOTE — Progress Notes (Signed)
  Subjective:    Patient ID: Kathy Watkins, female    DOB: [birth year], 51 y.o.   MRN: [id number]  HPI Acute visit Was seen last week with bronchitis, prescribed a Z-Pak and Allegra. She continued with cough, it is persistent, dry, hard a hard time sleeping last night.  Past medical history Hypertension Hyperlipidemia Past surgical history  hysterectomy Social history does not smoke   Review of Systems No fever or chills Mild nausea, no vomiting no diarrhea. Denies a history of asthma or wheezing. Denies any sinus pain or congestion. "My head is all clear"    Objective:   Physical Exam  Constitutional: She is oriented to person, place, and time. She appears well-developed and well-nourished. No distress.  HENT:  Head: Normocephalic and atraumatic.  Right Ear: External ear normal.  Left Ear: External ear normal.  Nose: Nose normal.  Mouth/Throat: No oropharyngeal exudate.       Face symmetric, nontender to palpation  Cardiovascular: Normal rate, regular rhythm and normal heart sounds.   No murmur heard. Pulmonary/Chest: Breath sounds normal. No respiratory distress. She has no wheezes. She has no rales.       Frequent cough noted  Neurological: She is alert and oriented to person, place, and time.  Skin: She is not diaphoretic.      Assessment & Plan:

## 2011-11-17 NOTE — Patient Instructions (Signed)
Rest, fluids , tylenol For cough, take Mucinex DM twice a day as needed  For persistent cough, use tussionex twice a day (drowsiness) Call if no better in few days Call anytime if the symptoms are severe

## 2011-11-17 NOTE — Telephone Encounter (Signed)
[facility] Triage Call Report Triage Record Num: [id number] Operator: Tomasita Crumble Patient Name: Kathy Watkins Call Date & Time: 11/17/2011 1:30:48PM Patient Phone: [phone] PCP: Patient Gender: Female PCP Fax : Patient DOB: 1961/03/15 Practice Name: [facility] Reason for Call: Caller: Tarina/Patient; PCP: Lelon Perla.; CB#: [phone]; Call regarding Cough/Congestion; Onset 11/09/11. Sharp, fleeting chest pain only occurring wth deep breath or cough and not responsive to home care. Advised see in 24 hours per Cough protocol. Appt. w/ Dr. Drue Novel at 14:15. Caller agreed. Protocol(s) Used: Cough - Adult Recommended Outcome per Protocol: See Provider within 24 hours Reason for Outcome: Sharp, fleeting chest pain only occurring with deep breath or cough AND not responsive to home care Care Advice: ~ Limit activities and increase periods of rest. ~ List, or take, all current prescription(s), nonprescription or alternative medication(s) to provider for evaluation.

## 2011-11-17 NOTE — Telephone Encounter (Signed)
To: [facility] (Daytime Triage) Fax: [phone] From: [facility] Date/ Time: 11/17/2011 8:21 AM Taken By: Lesli Albee, RN Caller: Kathy Watkins Facility: not collected Patient: Kathy, Watkins DOB: 06/03/1961 Phone: [phone] Reason for Call: Pt was dx with bronchitis on 11/12/11. Pt was prescribed an inhaler 2x BID and a z-pac which she finished yesterday. Pt is calling back to say there is no improvement. No fever but frequent, dry hacking cough. Office please call her back. Regarding Appointment: Appt Date: Appt Time: Unknown

## 2011-11-17 NOTE — Telephone Encounter (Signed)
Prednisone taper  10 mg  #18  3 po qd for 3 days , 2po qd for 3 days then 1 po qd for 3 days.    cxr  zpack in system for 10 days

## 2011-11-17 NOTE — Assessment & Plan Note (Addendum)
Bronchitis with persistent cough. Patient is not wheezing at the present time. Plan: Discontinue inhalers, cough control with Tussionex, if not better will need another antibiotic probably doxycycline. See instructions

## 2011-11-25 ENCOUNTER — Other Ambulatory Visit: Payer: Self-pay | Admitting: Family Medicine

## 2011-11-25 MED ORDER — HYDROCHLOROTHIAZIDE 25 MG PO TABS: 25.0000 mg | ORAL_TABLET | Freq: Every day | ORAL | Status: DC

## 2011-11-25 MED ORDER — AMLODIPINE BESYLATE 10 MG PO TABS: 10.0000 mg | ORAL_TABLET | Freq: Every day | ORAL | Status: DC

## 2011-11-25 NOTE — Telephone Encounter (Signed)
Prescription Refill   Hydrochlorot 25mg  (tablet)  Quantity: 90 day supply   Amlodipine 10mg  (tablet) Quantity: 90 day supply  Fax back to: [phone]

## 2011-12-02 ENCOUNTER — Telehealth: Payer: Self-pay | Admitting: Family Medicine

## 2011-12-02 MED ORDER — DOXYCYCLINE HYCLATE 100 MG PO TABS: 100.0000 mg | ORAL_TABLET | Freq: Two times a day (BID) | ORAL | Status: AC

## 2011-12-02 MED ORDER — HYDROCOD POLST-CHLORPHEN POLST 10-8 MG/5ML PO LQCR: 5.0000 mL | Freq: Two times a day (BID) | ORAL | Status: DC | PRN

## 2011-12-02 NOTE — Telephone Encounter (Signed)
Refill: Hydrocodone-chlorpheniram susp. Take 5ml by mouth every 12 hours as needed.

## 2011-12-02 NOTE — Telephone Encounter (Signed)
Ok call  #115 cc , no RF Also call doxycycline 100 mg 1 po bid x 1 week #14, no RF OV if no better in 10 days or if sx get worse

## 2011-12-02 NOTE — Telephone Encounter (Signed)
Refill done.  

## 2011-12-02 NOTE — Telephone Encounter (Signed)
Last filled on 2.19.13. OK to refill?

## 2011-12-11 ENCOUNTER — Telehealth: Payer: Self-pay | Admitting: Family Medicine

## 2011-12-11 MED ORDER — AMLODIPINE BESYLATE 10 MG PO TABS: 10.0000 mg | ORAL_TABLET | Freq: Every day | ORAL | Status: DC

## 2011-12-11 MED ORDER — HYDROCHLOROTHIAZIDE 25 MG PO TABS: 25.0000 mg | ORAL_TABLET | Freq: Every day | ORAL | Status: DC

## 2011-12-11 NOTE — Telephone Encounter (Signed)
Per [facility]  They have not received the following that show sent  Hydrocholorot tab 25mg  &  Amlodipine Tab 10mg   Can you please re-send to   [phone]  Thanks

## 2012-02-15 ENCOUNTER — Encounter: Payer: Self-pay | Admitting: Family Medicine

## 2012-02-15 ENCOUNTER — Ambulatory Visit (INDEPENDENT_AMBULATORY_CARE_PROVIDER_SITE_OTHER): Payer: BC Managed Care – PPO | Admitting: Family Medicine

## 2012-02-15 VITALS — BP 100/70 | HR 87 | Temp 98.3°F | Wt 169.2 lb

## 2012-02-15 DIAGNOSIS — M25512 Pain in left shoulder: Secondary | ICD-10-CM

## 2012-02-15 DIAGNOSIS — M25519 Pain in unspecified shoulder: Secondary | ICD-10-CM

## 2012-02-15 NOTE — Progress Notes (Signed)
  Subjective:    Kathy Watkins is a 51 y.o. female who presents with left shoulder pain. The symptoms began over 1 year ago. Aggravating factors: no known event. Pain is located in the posterior glenohumeral region. Discomfort is described as sharp/stabbing. Symptoms are exacerbated by overhead movements. Evaluation to date: none. Therapy to date includes: nothing specific.  The following portions of the patient's history were reviewed and updated as appropriate: allergies, current medications, past family history, past medical history, past social history, past surgical history and problem list.  Review of Systems Pertinent items are noted in HPI.   Objective:    BP 100/70  Pulse 87  Temp(Src) 98.3 F (36.8 C) (Oral)  Wt 169 lb 3.2 oz (76.749 kg)  SpO2 97% Right shoulder: normal active ROM, no tenderness, no impingement sign  Left shoulder: tenderness over the glenohumeral joint and motor exam normal     Assessment:    Left shoulder pain    Plan:    Natural history and expected course discussed. Questions answered. Agricultural engineer distributed. Rest, ice, compression, and elevation (RICE) therapy. OTC analgesics as needed. Orthopedics referral.

## 2012-02-15 NOTE — Patient Instructions (Signed)
Shoulder Pain The shoulder is a ball and socket joint. The muscles and tendons (rotator cuff) are what keep the shoulder in its joint and stable. This collection of muscles and tendons holds in the head (ball) of the humerus (upper arm bone) in the fossa (cup) of the scapula (shoulder blade). Today no reason was found for your shoulder pain. Often pain in the shoulder may be treated conservatively with temporary immobilization. For example, holding the shoulder in one place using a sling for rest. Physical therapy may be needed if problems continue. HOME CARE INSTRUCTIONS   Apply ice to the sore area for 15 to 20 minutes, 3 to 4 times per day for the first 2 days. Put the ice in a plastic bag. Place a towel between the bag of ice and your skin.   If you have or were given a shoulder sling and straps, do not remove for as long as directed by your caregiver or until you see a caregiver for a follow-up examination. If you need to remove it to shower or bathe, move your arm as little as possible.   Sleep on several pillows at night to lessen swelling and pain.   Only take over-the-counter or prescription medicines for pain, discomfort, or fever as directed by your caregiver.   Keep any follow-up appointments in order to avoid any type of permanent shoulder disability or chronic pain problems.  SEEK MEDICAL CARE IF:   Pain in your shoulder increases or new pain develops in your arm, hand, or fingers.   Your hand or fingers are colder than your other hand.   You do not obtain pain relief with the medications or your pain becomes worse.  SEEK IMMEDIATE MEDICAL CARE IF:   Your arm, hand, or fingers are numb or tingling.   Your arm, hand, or fingers are swollen, painful, or turn white or blue.   You develop chest pain or shortness of breath.  MAKE SURE YOU:   Understand these instructions.   Will watch your condition.   Will get help right away if you are not doing well or get worse.    Document Released: 06/24/2005 Document Revised: 09/03/2011 Document Reviewed: 08/29/2011 Exit[employer] Patient Information 2012 [employer]. 

## 2012-03-21 ENCOUNTER — Other Ambulatory Visit: Payer: Self-pay | Admitting: Family Medicine

## 2012-03-21 MED ORDER — HYDROCHLOROTHIAZIDE 25 MG PO TABS: 25.0000 mg | ORAL_TABLET | Freq: Every day | ORAL | Status: DC

## 2012-03-21 MED ORDER — AMLODIPINE BESYLATE 10 MG PO TABS: 10.0000 mg | ORAL_TABLET | Freq: Every day | ORAL | Status: DC

## 2012-03-21 NOTE — Telephone Encounter (Signed)
Refills x 2  Last OV 5.20.13  1-Amlodipine besylate 10mg  tab #90, take one tablet by mouth daily, last fill 2.27.13 We show last wrt. 3.15.13  2-Hydrochlorothiazide 25 mg tab #90, take one tablet by mouth daily, last fill 2.27.13 We show last wrt 3.15.13

## 2012-04-12 ENCOUNTER — Other Ambulatory Visit: Payer: Self-pay | Admitting: Family Medicine

## 2012-04-12 MED ORDER — ROSUVASTATIN CALCIUM 5 MG PO TABS: 5.0000 mg | ORAL_TABLET | Freq: Every day | ORAL | Status: DC

## 2012-04-12 NOTE — Telephone Encounter (Signed)
dicu

## 2012-04-12 NOTE — Telephone Encounter (Signed)
Discussed with patient and made her aware the Rx has been faxed but she is due for labs and an OV for a CPE with labs, she voiced understanding. Apt scheduled for august 28 at 930 with Dr.Lowne, Rx sent with 1 refill to get patient to her apt.     KP

## 2012-04-12 NOTE — Telephone Encounter (Signed)
TRIAGE MESS:  7.16.13 8:29am  Kathy Watkins, cb# [phone]  Message: Called in RX refill for Crestor to [facility] on [address] last week, per Pharmacy they have contacted [place] 3-times since last Wednesday, and we  Are not responding.  NOTE I do not see a refill request from [facility] for anything, Last refill for anything dated 6.24.13 Called patient back advised no request ever received here [facility] for Crestot , last refill recv was for amlodipine & dated 6.24.13-would put in a refill request for the crestor today for approval by dr., & would send if and when approved  CRESTOR 5 MG,Last wrt 8.3.12, #30 wt/5-refills, inst. TAKE 1 TABLET BY MOUTH EVERY DAY AS DIRECTED, last ov 5.20.13

## 2012-04-21 ENCOUNTER — Other Ambulatory Visit: Payer: Self-pay | Admitting: Obstetrics and Gynecology

## 2012-04-21 DIAGNOSIS — Z1231 Encounter for screening mammogram for malignant neoplasm of breast: Secondary | ICD-10-CM

## 2012-05-03 ENCOUNTER — Telehealth: Payer: Self-pay

## 2012-05-03 ENCOUNTER — Ambulatory Visit
Admission: RE | Admit: 2012-05-03 | Discharge: 2012-05-03 | Disposition: A | Discharge status: Home / Self Care | Payer: BC Managed Care – PPO | Source: Ambulatory Visit | Attending: Obstetrics and Gynecology | Admitting: Obstetrics and Gynecology

## 2012-05-03 DIAGNOSIS — Z1231 Encounter for screening mammogram for malignant neoplasm of breast: Secondary | ICD-10-CM

## 2012-05-03 NOTE — Telephone Encounter (Signed)
PA complete for Crestor 5 mg approved---Letter sent to be scanned      KP

## 2012-05-25 ENCOUNTER — Ambulatory Visit (INDEPENDENT_AMBULATORY_CARE_PROVIDER_SITE_OTHER): Payer: BC Managed Care – PPO | Admitting: Family Medicine

## 2012-05-25 ENCOUNTER — Encounter: Payer: Self-pay | Admitting: Family Medicine

## 2012-05-25 VITALS — BP 116/70 | HR 70 | Temp 98.4°F | Ht 65.5 in | Wt 168.2 lb

## 2012-05-25 DIAGNOSIS — E785 Hyperlipidemia, unspecified: Secondary | ICD-10-CM

## 2012-05-25 DIAGNOSIS — Z0000 Encounter for general adult medical examination without abnormal findings: Secondary | ICD-10-CM

## 2012-05-25 DIAGNOSIS — I10 Essential (primary) hypertension: Secondary | ICD-10-CM

## 2012-05-25 LAB — POCT URINALYSIS DIPSTICK
Bilirubin, UA: NEGATIVE
Blood, UA: NEGATIVE
Ketones, UA: NEGATIVE
Protein, UA: NEGATIVE
Spec Grav, UA: 1.01
pH, UA: 8

## 2012-05-25 LAB — LIPID PANEL
Cholesterol: 178 mg/dL (ref 0–200)
HDL: 56.5 mg/dL (ref >39.00)
Triglycerides: 81 mg/dL (ref 0.0–149.0)
VLDL: 16.2 mg/dL (ref 0.0–40.0)

## 2012-05-25 LAB — CBC WITH DIFFERENTIAL/PLATELET
Basophils Absolute: 0.1 10*3/uL (ref 0.0–0.1)
Eosinophils Absolute: 0.1 10*3/uL (ref 0.0–0.7)
Hemoglobin: 14.6 g/dL (ref 12.0–15.0)
Lymphocytes Relative: 29.4 % (ref 12.0–46.0)
MCHC: 32.2 g/dL (ref 30.0–36.0)
MCV: 92.2 fl (ref 78.0–100.0)
Monocytes Absolute: 0.6 10*3/uL (ref 0.1–1.0)
Neutro Abs: 5.4 10*3/uL (ref 1.4–7.7)
RDW: 13.3 % (ref 11.5–14.6)

## 2012-05-25 LAB — BASIC METABOLIC PANEL
CO2: 28 mEq/L (ref 19–32)
Calcium: 9.3 mg/dL (ref 8.4–10.5)
Chloride: 102 mEq/L (ref 96–112)
Creatinine, Ser: 0.8 mg/dL (ref 0.4–1.2)
Glucose, Bld: 80 mg/dL (ref 70–99)
Sodium: 139 mEq/L (ref 135–145)

## 2012-05-25 LAB — HEPATIC FUNCTION PANEL
Albumin: 3.8 g/dL (ref 3.5–5.2)
Alkaline Phosphatase: 53 U/L (ref 39–117)
Total Protein: 7.3 g/dL (ref 6.0–8.3)

## 2012-05-25 NOTE — Assessment & Plan Note (Signed)
Check labs con't meds 

## 2012-05-25 NOTE — Progress Notes (Signed)
  Subjective:     Kathy Watkins is a 51 y.o. female and is here for a comprehensive physical exam. The patient reports no problems.  History   Social History  . Marital Status: Married    Spouse Name: N/A    Number of Children: N/A  . Years of Education: N/A   Occupational History  . Not on file.   Social History Main Topics  . Smoking status: Never Smoker   . Smokeless tobacco: Never Used  . Alcohol Use: 1.2 oz/week    2 Glasses of wine per week  . Drug Use: No  . Sexually Active: Yes -- Female partner(s)   Other Topics Concern  . Not on file   Social History Narrative   Exercise-- walks 5 nights a week 3 miles   Health Maintenance  Topic Date Due  . Influenza Vaccine  06/28/2012  . Mammogram  05/03/2014  . Pap Smear  06/28/2014  . Colonoscopy  10/21/2015  . Tetanus/tdap  11/06/2017    The following portions of the patient's history were reviewed and updated as appropriate: allergies, current medications, past family history, past medical history, past social history, past surgical history and problem list.  Review of Systems Review of Systems  Constitutional: Negative for activity change, appetite change and fatigue.  HENT: Negative for hearing loss, congestion, tinnitus and ear discharge.  dentist q55m Eyes: Negative for visual disturbance (see optho q2y) Respiratory: Negative for cough, chest tightness and shortness of breath.   Cardiovascular: Negative for chest pain, palpitations and leg swelling.  Gastrointestinal: Negative for abdominal pain, diarrhea, constipation and abdominal distention.  Genitourinary: Negative for urgency, frequency, decreased urine volume and difficulty urinating.  Musculoskeletal: Negative for back pain, arthralgias and gait problem.  Skin: Negative for color change, pallor and rash.  Neurological: Negative for dizziness, light-headedness, numbness and headaches.  Hematological: Negative for adenopathy. Does not bruise/bleed easily.    Psychiatric/Behavioral: Negative for suicidal ideas, confusion, sleep disturbance, self-injury, dysphoric mood, decreased concentration and agitation.       Objective:    BP 116/70  Pulse 70  Temp 98.4 F (36.9 C) (Oral)  Ht 5' 5.5" (1.664 m)  Wt 168 lb 3.2 oz (76.295 kg)  BMI 27.56 kg/m2  SpO2 98% General appearance: alert, cooperative, appears stated age and no distress Head: Normocephalic, without obvious abnormality, atraumatic Eyes: conjunctivae/corneas clear. PERRL, EOM's intact. Fundi benign. Ears: normal TM's and external ear canals both ears Nose: Nares normal. Septum midline. Mucosa normal. No drainage or sinus tenderness. Throat: lips, mucosa, and tongue normal; teeth and gums normal Neck: no adenopathy, no carotid bruit, no JVD, supple, symmetrical, trachea midline and thyroid not enlarged, symmetric, no tenderness/mass/nodules Back: symmetric, no curvature. ROM normal. No CVA tenderness. Lungs: clear to auscultation bilaterally Breasts: gyn Heart: regular rate and rhythm, S1, S2 normal, no murmur, click, rub or gallop Abdomen: soft, non-tender; bowel sounds normal; no masses,  no organomegaly Pelvic: deferred--gyn Extremities: extremities normal, atraumatic, no cyanosis or edema Pulses: 2+ and symmetric Skin: Skin color, texture, turgor normal. No rashes or lesions Lymph nodes: Cervical, supraclavicular, and axillary nodes normal. Neurologic: Alert and oriented X 3, normal strength and tone. Normal symmetric reflexes. Normal coordination and gait psych-- no depression, no anxiety    Assessment:    Healthy female exam.      Plan:    ghm utd Check fasting labs See After Visit Summary for Counseling Recommendations

## 2012-05-25 NOTE — Assessment & Plan Note (Signed)
Stable con't meds 

## 2012-05-25 NOTE — Patient Instructions (Signed)
Preventive Care for Adults, Female A healthy lifestyle and preventive care can promote health and wellness. Preventive health guidelines for women include the following key practices.  A routine yearly physical is a good way to check with your caregiver about your health and preventive screening. It is a chance to share any concerns and updates on your health, and to receive a thorough exam.   Visit your dentist for a routine exam and preventive care every 6 months. Brush your teeth twice a day and floss once a day. Good oral hygiene prevents tooth decay and gum disease.   The frequency of eye exams is based on your age, health, family medical history, use of contact lenses, and other factors. Follow your caregiver's recommendations for frequency of eye exams.   Eat a healthy diet. Foods like vegetables, fruits, whole grains, low-fat dairy products, and lean protein foods contain the nutrients you need without too many calories. Decrease your intake of foods high in solid fats, added sugars, and salt. Eat the right amount of calories for you.Get information about a proper diet from your caregiver, if necessary.   Regular physical exercise is one of the most important things you can do for your health. Most adults should get at least 150 minutes of moderate-intensity exercise (any activity that increases your heart rate and causes you to sweat) each week. In addition, most adults need muscle-strengthening exercises on 2 or more days a week.   Maintain a healthy weight. The body mass index (BMI) is a screening tool to identify possible weight problems. It provides an estimate of body fat based on height and weight. Your caregiver can help determine your BMI, and can help you achieve or maintain a healthy weight.For adults 20 years and older:   A BMI below 18.5 is considered underweight.   A BMI of 18.5 to 24.9 is normal.   A BMI of 25 to 29.9 is considered overweight.   A BMI of 30 and above is  considered obese.   Maintain normal blood lipids and cholesterol levels by exercising and minimizing your intake of saturated fat. Eat a balanced diet with plenty of fruit and vegetables. Blood tests for lipids and cholesterol should begin at age 20 and be repeated every 5 years. If your lipid or cholesterol levels are high, you are over 50, or you are at high risk for heart disease, you may need your cholesterol levels checked more frequently.Ongoing high lipid and cholesterol levels should be treated with medicines if diet and exercise are not effective.   If you smoke, find out from your caregiver how to quit. If you do not use tobacco, do not start.   If you are pregnant, do not drink alcohol. If you are breastfeeding, be very cautious about drinking alcohol. If you are not pregnant and choose to drink alcohol, do not exceed 1 drink per day. One drink is considered to be 12 ounces (355 mL) of beer, 5 ounces (148 mL) of wine, or 1.5 ounces (44 mL) of liquor.   Avoid use of street drugs. Do not share needles with anyone. Ask for help if you need support or instructions about stopping the use of drugs.   High blood pressure causes heart disease and increases the risk of stroke. Your blood pressure should be checked at least every 1 to 2 years. Ongoing high blood pressure should be treated with medicines if weight loss and exercise are not effective.   If you are 55 to 52   years old, ask your caregiver if you should take aspirin to prevent strokes.   Diabetes screening involves taking a blood sample to check your fasting blood sugar level. This should be done once every 3 years, after age 45, if you are within normal weight and without risk factors for diabetes. Testing should be considered at a younger age or be carried out more frequently if you are overweight and have at least 1 risk factor for diabetes.   Breast cancer screening is essential preventive care for women. You should practice "breast  self-awareness." This means understanding the normal appearance and feel of your breasts and may include breast self-examination. Any changes detected, no matter how small, should be reported to a caregiver. Women in their 20s and 30s should have a clinical breast exam (CBE) by a caregiver as part of a regular health exam every 1 to 3 years. After age 40, women should have a CBE every year. Starting at age 40, women should consider having a mammography (breast X-ray test) every year. Women who have a family history of breast cancer should talk to their caregiver about genetic screening. Women at a high risk of breast cancer should talk to their caregivers about having magnetic resonance imaging (MRI) and a mammography every year.   The Pap test is a screening test for cervical cancer. A Pap test can show cell changes on the cervix that might become cervical cancer if left untreated. A Pap test is a procedure in which cells are obtained and examined from the lower end of the uterus (cervix).   Women should have a Pap test starting at age 21.   Between ages 21 and 29, Pap tests should be repeated every 2 years.   Beginning at age 30, you should have a Pap test every 3 years as long as the past 3 Pap tests have been normal.   Some women have medical problems that increase the chance of getting cervical cancer. Talk to your caregiver about these problems. It is especially important to talk to your caregiver if a new problem develops soon after your last Pap test. In these cases, your caregiver may recommend more frequent screening and Pap tests.   The above recommendations are the same for women who have or have not gotten the vaccine for human papillomavirus (HPV).   If you had a hysterectomy for a problem that was not cancer or a condition that could lead to cancer, then you no longer need Pap tests. Even if you no longer need a Pap test, a regular exam is a good idea to make sure no other problems are  starting.   If you are between ages 65 and 70, and you have had normal Pap tests going back 10 years, you no longer need Pap tests. Even if you no longer need a Pap test, a regular exam is a good idea to make sure no other problems are starting.   If you have had past treatment for cervical cancer or a condition that could lead to cancer, you need Pap tests and screening for cancer for at least 20 years after your treatment.   If Pap tests have been discontinued, risk factors (such as a new sexual partner) need to be reassessed to determine if screening should be resumed.   The HPV test is an additional test that may be used for cervical cancer screening. The HPV test looks for the virus that can cause the cell changes on the cervix.   The cells collected during the Pap test can be tested for HPV. The HPV test could be used to screen women aged 30 years and older, and should be used in women of any age who have unclear Pap test results. After the age of 30, women should have HPV testing at the same frequency as a Pap test.   Colorectal cancer can be detected and often prevented. Most routine colorectal cancer screening begins at the age of 50 and continues through age 75. However, your caregiver may recommend screening at an earlier age if you have risk factors for colon cancer. On a yearly basis, your caregiver may provide home test kits to check for hidden blood in the stool. Use of a small camera at the end of a tube, to directly examine the colon (sigmoidoscopy or colonoscopy), can detect the earliest forms of colorectal cancer. Talk to your caregiver about this at age 50, when routine screening begins. Direct examination of the colon should be repeated every 5 to 10 years through age 75, unless early forms of pre-cancerous polyps or small growths are found.   Hepatitis C blood testing is recommended for all people born from 1945 through 1965 and any individual with known risks for hepatitis C.    Practice safe sex. Use condoms and avoid high-risk sexual practices to reduce the spread of sexually transmitted infections (STIs). STIs include gonorrhea, chlamydia, syphilis, trichomonas, herpes, HPV, and human immunodeficiency virus (HIV). Herpes, HIV, and HPV are viral illnesses that have no cure. They can result in disability, cancer, and death. Sexually active women aged 25 and younger should be checked for chlamydia. Older women with new or multiple partners should also be tested for chlamydia. Testing for other STIs is recommended if you are sexually active and at increased risk.   Osteoporosis is a disease in which the bones lose minerals and strength with aging. This can result in serious bone fractures. The risk of osteoporosis can be identified using a bone density scan. Women ages 65 and over and women at risk for fractures or osteoporosis should discuss screening with their caregivers. Ask your caregiver whether you should take a calcium supplement or vitamin D to reduce the rate of osteoporosis.   Menopause can be associated with physical symptoms and risks. Hormone replacement therapy is available to decrease symptoms and risks. You should talk to your caregiver about whether hormone replacement therapy is right for you.   Use sunscreen with sun protection factor (SPF) of 30 or more. Apply sunscreen liberally and repeatedly throughout the day. You should seek shade when your shadow is shorter than you. Protect yourself by wearing long sleeves, pants, a wide-brimmed hat, and sunglasses year round, whenever you are outdoors.   Once a month, do a whole body skin exam, using a mirror to look at the skin on your back. Notify your caregiver of new moles, moles that have irregular borders, moles that are larger than a pencil eraser, or moles that have changed in shape or color.   Stay current with required immunizations.   Influenza. You need a dose every fall (or winter). The composition of  the flu vaccine changes each year, so being vaccinated once is not enough.   Pneumococcal polysaccharide. You need 1 to 2 doses if you smoke cigarettes or if you have certain chronic medical conditions. You need 1 dose at age 65 (or older) if you have never been vaccinated.   Tetanus, diphtheria, pertussis (Tdap, Td). Get 1 dose of   Tdap vaccine if you are younger than age 65, are over 65 and have contact with an infant, are a healthcare worker, are pregnant, or simply want to be protected from whooping cough. After that, you need a Td booster dose every 10 years. Consult your caregiver if you have not had at least 3 tetanus and diphtheria-containing shots sometime in your life or have a deep or dirty wound.   HPV. You need this vaccine if you are a woman age 26 or younger. The vaccine is given in 3 doses over 6 months.   Measles, mumps, rubella (MMR). You need at least 1 dose of MMR if you were born in 1957 or later. You may also need a second dose.   Meningococcal. If you are age 19 to 21 and a first-year college student living in a residence hall, or have one of several medical conditions, you need to get vaccinated against meningococcal disease. You may also need additional booster doses.   Zoster (shingles). If you are age 60 or older, you should get this vaccine.   Varicella (chickenpox). If you have never had chickenpox or you were vaccinated but received only 1 dose, talk to your caregiver to find out if you need this vaccine.   Hepatitis A. You need this vaccine if you have a specific risk factor for hepatitis A virus infection or you simply wish to be protected from this disease. The vaccine is usually given as 2 doses, 6 to 18 months apart.   Hepatitis B. You need this vaccine if you have a specific risk factor for hepatitis B virus infection or you simply wish to be protected from this disease. The vaccine is given in 3 doses, usually over 6 months.  Preventive Services /  Frequency Ages 19 to 39  Blood pressure check.** / Every 1 to 2 years.   Lipid and cholesterol check.** / Every 5 years beginning at age 20.   Clinical breast exam.** / Every 3 years for women in their 20s and 30s.   Pap test.** / Every 2 years from ages 21 through 29. Every 3 years starting at age 30 through age 65 or 70 with a history of 3 consecutive normal Pap tests.   HPV screening.** / Every 3 years from ages 30 through ages 65 to 70 with a history of 3 consecutive normal Pap tests.   Hepatitis C blood test.** / For any individual with known risks for hepatitis C.   Skin self-exam. / Monthly.   Influenza immunization.** / Every year.   Pneumococcal polysaccharide immunization.** / 1 to 2 doses if you smoke cigarettes or if you have certain chronic medical conditions.   Tetanus, diphtheria, pertussis (Tdap, Td) immunization. / A one-time dose of Tdap vaccine. After that, you need a Td booster dose every 10 years.   HPV immunization. / 3 doses over 6 months, if you are 26 and younger.   Measles, mumps, rubella (MMR) immunization. / You need at least 1 dose of MMR if you were born in 1957 or later. You may also need a second dose.   Meningococcal immunization. / 1 dose if you are age 19 to 21 and a first-year college student living in a residence hall, or have one of several medical conditions, you need to get vaccinated against meningococcal disease. You may also need additional booster doses.   Varicella immunization.** / Consult your caregiver.   Hepatitis A immunization.** / Consult your caregiver. 2 doses, 6 to 18 months   apart.   Hepatitis B immunization.** / Consult your caregiver. 3 doses usually over 6 months.  Ages 40 to 64  Blood pressure check.** / Every 1 to 2 years.   Lipid and cholesterol check.** / Every 5 years beginning at age 20.   Clinical breast exam.** / Every year after age 40.   Mammogram.** / Every year beginning at age 40 and continuing for as  long as you are in good health. Consult with your caregiver.   Pap test.** / Every 3 years starting at age 30 through age 65 or 70 with a history of 3 consecutive normal Pap tests.   HPV screening.** / Every 3 years from ages 30 through ages 65 to 70 with a history of 3 consecutive normal Pap tests.   Fecal occult blood test (FOBT) of stool. / Every year beginning at age 50 and continuing until age 75. You may not need to do this test if you get a colonoscopy every 10 years.   Flexible sigmoidoscopy or colonoscopy.** / Every 5 years for a flexible sigmoidoscopy or every 10 years for a colonoscopy beginning at age 50 and continuing until age 75.   Hepatitis C blood test.** / For all people born from 1945 through 1965 and any individual with known risks for hepatitis C.   Skin self-exam. / Monthly.   Influenza immunization.** / Every year.   Pneumococcal polysaccharide immunization.** / 1 to 2 doses if you smoke cigarettes or if you have certain chronic medical conditions.   Tetanus, diphtheria, pertussis (Tdap, Td) immunization.** / A one-time dose of Tdap vaccine. After that, you need a Td booster dose every 10 years.   Measles, mumps, rubella (MMR) immunization. / You need at least 1 dose of MMR if you were born in 1957 or later. You may also need a second dose.   Varicella immunization.** / Consult your caregiver.   Meningococcal immunization.** / Consult your caregiver.   Hepatitis A immunization.** / Consult your caregiver. 2 doses, 6 to 18 months apart.   Hepatitis B immunization.** / Consult your caregiver. 3 doses, usually over 6 months.  Ages 65 and over  Blood pressure check.** / Every 1 to 2 years.   Lipid and cholesterol check.** / Every 5 years beginning at age 20.   Clinical breast exam.** / Every year after age 40.   Mammogram.** / Every year beginning at age 40 and continuing for as long as you are in good health. Consult with your caregiver.   Pap test.** /  Every 3 years starting at age 30 through age 65 or 70 with a 3 consecutive normal Pap tests. Testing can be stopped between 65 and 70 with 3 consecutive normal Pap tests and no abnormal Pap or HPV tests in the past 10 years.   HPV screening.** / Every 3 years from ages 30 through ages 65 or 70 with a history of 3 consecutive normal Pap tests. Testing can be stopped between 65 and 70 with 3 consecutive normal Pap tests and no abnormal Pap or HPV tests in the past 10 years.   Fecal occult blood test (FOBT) of stool. / Every year beginning at age 50 and continuing until age 75. You may not need to do this test if you get a colonoscopy every 10 years.   Flexible sigmoidoscopy or colonoscopy.** / Every 5 years for a flexible sigmoidoscopy or every 10 years for a colonoscopy beginning at age 50 and continuing until age 75.   Hepatitis   C blood test.** / For all people born from 1945 through 1965 and any individual with known risks for hepatitis C.   Osteoporosis screening.** / A one-time screening for women ages 65 and over and women at risk for fractures or osteoporosis.   Skin self-exam. / Monthly.   Influenza immunization.** / Every year.   Pneumococcal polysaccharide immunization.** / 1 dose at age 65 (or older) if you have never been vaccinated.   Tetanus, diphtheria, pertussis (Tdap, Td) immunization. / A one-time dose of Tdap vaccine if you are over 65 and have contact with an infant, are a healthcare worker, or simply want to be protected from whooping cough. After that, you need a Td booster dose every 10 years.   Varicella immunization.** / Consult your caregiver.   Meningococcal immunization.** / Consult your caregiver.   Hepatitis A immunization.** / Consult your caregiver. 2 doses, 6 to 18 months apart.   Hepatitis B immunization.** / Check with your caregiver. 3 doses, usually over 6 months.  ** Family history and personal history of risk and conditions may change your caregiver's  recommendations. Document Released: 11/10/2001 Document Revised: 09/03/2011 Document Reviewed: 02/09/2011 [employer] Patient Information 2012 [employer]. 

## 2012-05-27 MED ORDER — POTASSIUM CHLORIDE CRYS ER 20 MEQ PO TBCR: 20.0000 meq | EXTENDED_RELEASE_TABLET | Freq: Every day | ORAL | Status: DC

## 2012-07-27 ENCOUNTER — Ambulatory Visit (INDEPENDENT_AMBULATORY_CARE_PROVIDER_SITE_OTHER): Payer: BC Managed Care – PPO

## 2012-07-27 DIAGNOSIS — Z23 Encounter for immunization: Secondary | ICD-10-CM

## 2012-08-02 ENCOUNTER — Other Ambulatory Visit: Payer: Self-pay

## 2012-08-02 MED ORDER — ROSUVASTATIN CALCIUM 5 MG PO TABS: 5.0000 mg | ORAL_TABLET | Freq: Every day | ORAL | Status: DC

## 2012-08-02 NOTE — Telephone Encounter (Signed)
Pt verified pharmacy Rx sent.   MW

## 2012-12-05 ENCOUNTER — Other Ambulatory Visit: Payer: Self-pay | Admitting: Family Medicine

## 2012-12-25 ENCOUNTER — Other Ambulatory Visit: Payer: Self-pay | Admitting: Family Medicine

## 2013-02-05 ENCOUNTER — Other Ambulatory Visit: Payer: Self-pay | Admitting: Family Medicine

## 2013-02-10 ENCOUNTER — Other Ambulatory Visit (INDEPENDENT_AMBULATORY_CARE_PROVIDER_SITE_OTHER): Payer: BC Managed Care – PPO

## 2013-02-10 DIAGNOSIS — E785 Hyperlipidemia, unspecified: Secondary | ICD-10-CM

## 2013-02-10 LAB — HEPATIC FUNCTION PANEL
AST: 19 U/L (ref 0–37)
Albumin: 3.8 g/dL (ref 3.5–5.2)
Total Bilirubin: 0.5 mg/dL (ref 0.3–1.2)

## 2013-02-10 LAB — LIPID PANEL
HDL: 44.4 mg/dL (ref >39.00)
Total CHOL/HDL Ratio: 3
Triglycerides: 91 mg/dL (ref 0.0–149.0)
VLDL: 18.2 mg/dL (ref 0.0–40.0)

## 2013-03-09 ENCOUNTER — Other Ambulatory Visit: Payer: Self-pay | Admitting: Family Medicine

## 2013-03-09 NOTE — Telephone Encounter (Signed)
Rf request for Xanax last seen 05/25/12 patient is seen once a year. Please advise    KP

## 2013-03-09 NOTE — Telephone Encounter (Signed)
Refill x1 

## 2013-04-22 ENCOUNTER — Other Ambulatory Visit: Payer: Self-pay | Admitting: Family Medicine

## 2013-04-29 ENCOUNTER — Other Ambulatory Visit: Payer: Self-pay | Admitting: Family Medicine

## 2013-06-30 ENCOUNTER — Other Ambulatory Visit: Payer: Self-pay

## 2013-06-30 DIAGNOSIS — Z1231 Encounter for screening mammogram for malignant neoplasm of breast: Secondary | ICD-10-CM

## 2013-07-20 ENCOUNTER — Ambulatory Visit
Admission: RE | Admit: 2013-07-20 | Discharge: 2013-07-20 | Disposition: A | Discharge status: Home / Self Care | Payer: BC Managed Care – PPO | Source: Ambulatory Visit

## 2013-07-20 DIAGNOSIS — Z1231 Encounter for screening mammogram for malignant neoplasm of breast: Secondary | ICD-10-CM

## 2013-07-26 ENCOUNTER — Other Ambulatory Visit: Payer: Self-pay | Admitting: Obstetrics and Gynecology

## 2013-07-26 DIAGNOSIS — R922 Inconclusive mammogram: Secondary | ICD-10-CM

## 2013-08-02 ENCOUNTER — Telehealth: Payer: Self-pay | Admitting: Family Medicine

## 2013-08-02 NOTE — Telephone Encounter (Signed)
This patient has not been seen in 1 year and she is due for a CPE. Please schedule      KP

## 2013-08-02 NOTE — Telephone Encounter (Signed)
Patient called and stated that it was time for her  6 month re-check for labs. She scheduled an apt for November 11th @ 9:00am. Please advise me. Thanks

## 2013-08-02 NOTE — Telephone Encounter (Signed)
Called patient back and scheduled her a cpe for December 12th at 2:00pm.

## 2013-08-03 ENCOUNTER — Other Ambulatory Visit: Payer: Self-pay

## 2013-08-04 ENCOUNTER — Other Ambulatory Visit: Payer: BC Managed Care – PPO

## 2013-08-07 ENCOUNTER — Ambulatory Visit
Admission: RE | Admit: 2013-08-07 | Discharge: 2013-08-07 | Disposition: A | Discharge status: Home / Self Care | Payer: BC Managed Care – PPO | Source: Ambulatory Visit | Attending: Obstetrics and Gynecology | Admitting: Obstetrics and Gynecology

## 2013-08-07 DIAGNOSIS — R9230 Dense breasts, unspecified: Secondary | ICD-10-CM

## 2013-08-07 DIAGNOSIS — R922 Inconclusive mammogram: Secondary | ICD-10-CM

## 2013-08-07 MED ORDER — GADOBENATE DIMEGLUMINE 529 MG/ML IV SOLN
14.0000 mL | Freq: Once | INTRAVENOUS | Status: AC | PRN
  Administered 2013-08-07: 14 mL via INTRAVENOUS

## 2013-08-08 ENCOUNTER — Other Ambulatory Visit: Payer: Self-pay | Admitting: Obstetrics and Gynecology

## 2013-08-08 ENCOUNTER — Ambulatory Visit: Payer: BC Managed Care – PPO

## 2013-08-08 ENCOUNTER — Other Ambulatory Visit: Payer: BC Managed Care – PPO

## 2013-08-08 DIAGNOSIS — R928 Other abnormal and inconclusive findings on diagnostic imaging of breast: Secondary | ICD-10-CM

## 2013-08-10 ENCOUNTER — Other Ambulatory Visit: Payer: BC Managed Care – PPO

## 2013-08-18 ENCOUNTER — Ambulatory Visit
Admission: RE | Admit: 2013-08-18 | Discharge: 2013-08-18 | Disposition: A | Discharge status: Home / Self Care | Payer: BC Managed Care – PPO | Source: Ambulatory Visit | Attending: Obstetrics and Gynecology | Admitting: Obstetrics and Gynecology

## 2013-08-18 DIAGNOSIS — R928 Other abnormal and inconclusive findings on diagnostic imaging of breast: Secondary | ICD-10-CM

## 2013-08-18 MED ORDER — GADOBENATE DIMEGLUMINE 529 MG/ML IV SOLN
14.0000 mL | Freq: Once | INTRAVENOUS | Status: AC | PRN
  Administered 2013-08-18: 14 mL via INTRAVENOUS

## 2013-08-30 ENCOUNTER — Ambulatory Visit (INDEPENDENT_AMBULATORY_CARE_PROVIDER_SITE_OTHER): Payer: BC Managed Care – PPO | Admitting: General Surgery

## 2013-08-30 ENCOUNTER — Encounter (INDEPENDENT_AMBULATORY_CARE_PROVIDER_SITE_OTHER): Payer: Self-pay | Admitting: General Surgery

## 2013-08-30 VITALS — BP 120/74 | HR 67 | Temp 98.0°F | Resp 18 | Ht 65.0 in | Wt 170.0 lb

## 2013-08-30 DIAGNOSIS — D0500 Lobular carcinoma in situ of unspecified breast: Secondary | ICD-10-CM | POA: Insufficient documentation

## 2013-08-30 DIAGNOSIS — C50919 Malignant neoplasm of unspecified site of unspecified female breast: Secondary | ICD-10-CM

## 2013-08-30 DIAGNOSIS — D0501 Lobular carcinoma in situ of right breast: Secondary | ICD-10-CM

## 2013-08-31 ENCOUNTER — Encounter: Payer: Self-pay | Admitting: *Deleted

## 2013-08-31 NOTE — Progress Notes (Signed)
Received a referral in the [facility] for this pt to be seen in the [facility].  I have sent this referral to [facility]. In HIM to schedule.  I have emailed Tiffany to make her aware that its in the [facility].  I have emailed Michelle & Dr. Carolynne Edouard at [facility] to make them aware.

## 2013-09-01 ENCOUNTER — Telehealth: Payer: Self-pay | Admitting: Oncology

## 2013-09-01 NOTE — Telephone Encounter (Signed)
S/w pt and gve new d/t 12/11 @ 2 w/Dr. Welton Flakes.

## 2013-09-06 ENCOUNTER — Encounter: Payer: BC Managed Care – PPO | Admitting: Oncology

## 2013-09-07 ENCOUNTER — Telehealth: Payer: Self-pay | Admitting: Oncology

## 2013-09-07 ENCOUNTER — Encounter: Payer: Self-pay | Admitting: Oncology

## 2013-09-07 ENCOUNTER — Encounter: Payer: BC Managed Care – PPO | Admitting: Oncology

## 2013-09-07 ENCOUNTER — Ambulatory Visit (HOSPITAL_BASED_OUTPATIENT_CLINIC_OR_DEPARTMENT_OTHER): Payer: BC Managed Care – PPO | Admitting: Oncology

## 2013-09-07 VITALS — BP 122/85 | HR 86 | Temp 98.6°F | Resp 18 | Ht 65.0 in | Wt 169.1 lb

## 2013-09-07 DIAGNOSIS — C50919 Malignant neoplasm of unspecified site of unspecified female breast: Secondary | ICD-10-CM

## 2013-09-07 DIAGNOSIS — D0501 Lobular carcinoma in situ of right breast: Secondary | ICD-10-CM

## 2013-09-07 DIAGNOSIS — Z808 Family history of malignant neoplasm of other organs or systems: Secondary | ICD-10-CM

## 2013-09-07 DIAGNOSIS — Z803 Family history of malignant neoplasm of breast: Secondary | ICD-10-CM

## 2013-09-07 NOTE — Progress Notes (Signed)
Kathy Watkins [id number] [birth year] 52 y.o. 09/07/2013 2:37 PM  CC  Loreen Freud, DO [phone] [facility] [address] [place] [address] Dr. Renae Fickle toth Dr. Zelphia Cairo  REASON FOR CONSULTATION:  LCIS of the right breast   REFERRING PHYSICIAN: Dr. Chevis Pretty  HISTORY OF PRESENT ILLNESS:  Kathy Watkins is a 52 y.o. female.  With recent mammogram (07/20/13) showing clacifications. Biopsy positive for LCIS. Seen by Dr. Carolynne Edouard who recommended lumpectomy. He also wanted her to be seen by med onc to discuss risk reduction for future breast cancer.  She has no other complaints   Past Medical History: Past Medical History  Diagnosis Date  . Hypertension   . Hyperlipidemia     Past Surgical History: Past Surgical History  Procedure Laterality Date  . Abdominal hysterectomy  1994    adenomyosis  . Breast surgery  2008    lumpectomy  . Parotid gland tumor excision  2000    Family History: Family History  Problem Relation Age of Onset  . Cancer Mother 87    breast  . Hypertension Mother   . Kidney disease Father   . Hypertension Father   . Hyperlipidemia Father   . Hypertension Sister   . Hyperlipidemia Sister   paternal GM colon at 35 Maternal GM lung cancer smoker Father prostate cancer 46 Paternal aunt melanoma 40   Social History History  Substance Use Topics  . Smoking status: Never Smoker   . Smokeless tobacco: Never Used  . Alcohol Use: 1.2 oz/week    2 Glasses of wine per week    Allergies: No Known Allergies  Current Medications: Current Outpatient Prescriptions  Medication Sig Dispense Refill  . amLODipine (NORVASC) 10 MG tablet Take 1 tablet (10 mg total) by mouth daily.  90 tablet  1  . hydrochlorothiazide (HYDRODIURIL) 25 MG tablet Take 1 tablet (25 mg total) by mouth daily.  90 tablet  1  . rosuvastatin (CRESTOR) 5 MG tablet TAKE 1 TABLET EVERY OTHER DAY      . ALPRAZolam (XANAX) 0.25 MG tablet TAKE 1 TABLET BY  MOUTH 3 TIMES A DAY AS NEEDED  60 tablet  0   No current facility-administered medications for this visit.    OB/GYN History: menarche at 40, perimenopausal, age at first birth 56, G66P2, OCP x 46 years age at first mammogram 59 and yearly since.  Fertility Discussion: n/a Prior History of Cancer: none  Health Maintenance:  Colonoscopy yes Bone Density no Last PAP smear 04/2013  ECOG PERFORMANCE STATUS: 0 - Asymptomatic  Genetic Counseling/testing: yes  REVIEW OF SYSTEMS:  A comprehensive review of systems was negative.  PHYSICAL EXAMINATION: Blood pressure 122/85, pulse 86, temperature 98.6 F (37 C), temperature source Oral, resp. rate 18, height 5\' 5"  (1.651 m), weight 169 lb 1.6 oz (76.703 kg).  NWG:NFAOZ, healthy, no distress, well nourished and well developed SKIN: skin color, texture, turgor are normal HEAD: Normocephalic EYES: PERRLA, EOMI, Conjunctiva are pink and non-injected EARS: External ears normal OROPHARYNX:no exudate, no erythema and lips, buccal mucosa, and tongue normal  NECK: no adenopathy LYMPH:  no palpable lymphadenopathy BREAST:breasts appear normal, no suspicious masses, no skin or nipple changes or axillary nodes LUNGS: clear to auscultation  HEART: regular rate & rhythm ABDOMEN:abdomen soft, non-tender, normal bowel sounds and no masses or organomegaly BACK: No CVA tenderness EXTREMITIES:no edema, no clubbing, no cyanosis  NEURO: alert & oriented x 3 with fluent speech, no focal motor/sensory deficits, gait normal  STUDIES/RESULTS: Mm Digital Diagnostic Unilat R  08/18/2013   CLINICAL DATA:  Status post MRI guided biopsy of the right breast.  EXAM: DIGITAL DIAGNOSTIC UNILATERAL RIGHT MAMMOGRAM  COMPARISON:  Previous exams.  FINDINGS: Films are performed following MRI guided biopsy of non mass enhancement in the right breast. Dumbbell-shaped clip is in the expected position in the outer slightly lower posterior right breast.  IMPRESSION:  Satisfactory placement of biopsy site marker following MRI guided biopsy of the right breast.  Final Assessment: Post Procedure Mammograms for Marker Placement   Electronically Signed   By: Jerene Dilling M.D.   On: 08/18/2013 11:50   Mr Elinor Parkinson Breast Bx Jones Bales Dev 1st Lesion Image Bx Spec Mr Guide  08/21/2013   ADDENDUM REPORT: 08/21/2013 09:30  ADDENDUM: Pathology reveals LCIS with calcifications. This is concordant. Surgical consultation is recommended. Results were discussed with the patient on 08/21/2013. Patient has no complaints regarding her biopsy site. She has an appointment with Dr. Carolynne Edouard on 08/30/2013 at 3:30 p.m.   Electronically Signed   By: Jerene Dilling M.D.   On: 08/21/2013 09:30   08/21/2013   CLINICAL DATA:  Segmental area of non mass enhancement in the right breast.  EXAM: MR BREAST BX W LOC DEV 1ST LESION IMAGE BX SPEC MR GUIDE*R*  TECHNIQUE: Multiplanar, multisequence MR imaging of the right breast was performed both before and after administration of intravenous contrast.  THREE-DIMENSIONAL MR IMAGE RENDERING ON INDEPENDENT WORKSTATION  Three-dimensional MR images were rendered by post-processing of the original MR data on an independent workstation. The three-dimensional MR images were interpreted, and findings were reported in the accompanying complete MRI report for this study.  CONTRAST:  14mL MULTIHANCE GADOBENATE DIMEGLUMINE 529 MG/ML IV SOLN  COMPARISON:  Previous exams.  FINDINGS: I met with the patient, and we discussed the procedure of MRI guided biopsy, including risks, benefits, and alternatives. Specifically, we discussed the risks of infection, bleeding, tissue injury, clip migration, and inadequate sampling. Informed, written consent was given. The usual time out protocol was performed immediately prior to the procedure.  Using sterile technique, 2% Lidocaine, MRI guidance, and a 9 gauge vacuum assisted device, biopsy was performed of non mass enhancement in the outer  right breast using a lateral to medial approach. At the conclusion of the procedure, a dumbbell-shaped tissue marker clip was deployed into the biopsy cavity. Follow-up 2-view mammogram was performed and dictated separately.  IMPRESSION: MRI guided biopsy of non mass enhancement in the outer right breast. No apparent complications.  Electronically Signed: By: Jerene Dilling M.D. On: 08/18/2013 11:52     LABS:    Chemistry      Component Value Date/Time   NA 139 05/25/2012 1020   K 3.2* 05/25/2012 1020   CL 102 05/25/2012 1020   CO2 28 05/25/2012 1020   BUN 12 05/25/2012 1020   CREATININE 0.8 05/25/2012 1020      Component Value Date/Time   CALCIUM 9.3 05/25/2012 1020   ALKPHOS 51 02/10/2013 0815   AST 19 02/10/2013 0815   ALT 18 02/10/2013 0815   BILITOT 0.5 02/10/2013 0815      Lab Results  Component Value Date   WBC 8.7 05/25/2012   HGB 14.6 05/25/2012   HCT 45.5 05/25/2012   MCV 92.2 05/25/2012   PLT 308.0 05/25/2012     ASSESSMENT  52 year old female with  1. LCIS right breast at high risk for future breast cancer  2. Family hx of cancers  PLAN:    1. Recommend genetic counseling prior to definitive surgery  2. If genetics negative then lumpectomy however patient is leaning towards bilateral mastectomies due to her always being concerned that she will develop cancer.  3. Discussed risk reduction with tamoxifen but patient does not want to take drugs that can cause other side effects  4. Discussed exeercise and diet and eating healthy        Discussion: Patient is being treated per NCCN breast cancer care guidelines appropriate high risk patient   Thank you so much for allowing me to participate in the care of Kathy Watkins. I will continue to follow up the patient with you and assist in her care.  All questions were answered. The patient knows to call the clinic with any problems, questions or concerns. We can certainly see the patient much sooner if  necessary.  I spent 40 minutes counseling the patient face to face. The total time spent in the appointment was 60 minutes.  Drue Second, MD Medical/Oncology [facility] [phone] (beeper) [phone] (Office)  09/07/2013, 2:37 PM

## 2013-09-08 ENCOUNTER — Telehealth: Payer: Self-pay | Admitting: *Deleted

## 2013-09-08 ENCOUNTER — Ambulatory Visit (INDEPENDENT_AMBULATORY_CARE_PROVIDER_SITE_OTHER): Payer: BC Managed Care – PPO | Admitting: Family Medicine

## 2013-09-08 ENCOUNTER — Encounter: Payer: Self-pay | Admitting: Family Medicine

## 2013-09-08 VITALS — BP 118/74 | HR 87 | Temp 97.7°F | Ht 65.5 in | Wt 169.0 lb

## 2013-09-08 DIAGNOSIS — Z23 Encounter for immunization: Secondary | ICD-10-CM

## 2013-09-08 DIAGNOSIS — C50919 Malignant neoplasm of unspecified site of unspecified female breast: Secondary | ICD-10-CM

## 2013-09-08 DIAGNOSIS — E785 Hyperlipidemia, unspecified: Secondary | ICD-10-CM

## 2013-09-08 DIAGNOSIS — D0500 Lobular carcinoma in situ of unspecified breast: Secondary | ICD-10-CM

## 2013-09-08 DIAGNOSIS — I10 Essential (primary) hypertension: Secondary | ICD-10-CM

## 2013-09-08 DIAGNOSIS — Z0000 Encounter for general adult medical examination without abnormal findings: Secondary | ICD-10-CM

## 2013-09-08 NOTE — Patient Instructions (Signed)
Preventive Care for Adults, Female A healthy lifestyle and preventive care can promote health and wellness. Preventive health guidelines for women include the following key practices.  A routine yearly physical is a good way to check with your caregiver about your health and preventive screening. It is a chance to share any concerns and updates on your health, and to receive a thorough exam.  Visit your dentist for a routine exam and preventive care every 6 months. Brush your teeth twice a day and floss once a day. Good oral hygiene prevents tooth decay and gum disease.  The frequency of eye exams is based on your age, health, family medical history, use of contact lenses, and other factors. Follow your caregiver's recommendations for frequency of eye exams.  Eat a healthy diet. Foods like vegetables, fruits, whole grains, low-fat dairy products, and lean protein foods contain the nutrients you need without too many calories. Decrease your intake of foods high in solid fats, added sugars, and salt. Eat the right amount of calories for you.Get information about a proper diet from your caregiver, if necessary.  Regular physical exercise is one of the most important things you can do for your health. Most adults should get at least 150 minutes of moderate-intensity exercise (any activity that increases your heart rate and causes you to sweat) each week. In addition, most adults need muscle-strengthening exercises on 2 or more days a week.  Maintain a healthy weight. The body mass index (BMI) is a screening tool to identify possible weight problems. It provides an estimate of body fat based on height and weight. Your caregiver can help determine your BMI, and can help you achieve or maintain a healthy weight.For adults 20 years and older:  A BMI below 18.5 is considered underweight.  A BMI of 18.5 to 24.9 is normal.  A BMI of 25 to 29.9 is considered overweight.  A BMI of 30 and above is  considered obese.  Maintain normal blood lipids and cholesterol levels by exercising and minimizing your intake of saturated fat. Eat a balanced diet with plenty of fruit and vegetables. Blood tests for lipids and cholesterol should begin at age 20 and be repeated every 5 years. If your lipid or cholesterol levels are high, you are over 50, or you are at high risk for heart disease, you may need your cholesterol levels checked more frequently.Ongoing high lipid and cholesterol levels should be treated with medicines if diet and exercise are not effective.  If you smoke, find out from your caregiver how to quit. If you do not use tobacco, do not start.  Lung cancer screening is recommended for adults aged 55 80 years who are at high risk for developing lung cancer because of a history of smoking. Yearly low-dose computed tomography (CT) is recommended for people who have at least a 30-pack-year history of smoking and are a current smoker or have quit within the past 15 years. A pack year of smoking is smoking an average of 1 pack of cigarettes a day for 1 year (for example: 1 pack a day for 30 years or 2 packs a day for 15 years). Yearly screening should continue until the smoker has stopped smoking for at least 15 years. Yearly screening should also be stopped for people who develop a health problem that would prevent them from having lung cancer treatment.  If you are pregnant, do not drink alcohol. If you are breastfeeding, be very cautious about drinking alcohol. If you are   not pregnant and choose to drink alcohol, do not exceed 1 drink per day. One drink is considered to be 12 ounces (355 mL) of beer, 5 ounces (148 mL) of wine, or 1.5 ounces (44 mL) of liquor.  Avoid use of street drugs. Do not share needles with anyone. Ask for help if you need support or instructions about stopping the use of drugs.  High blood pressure causes heart disease and increases the risk of stroke. Your blood pressure  should be checked at least every 1 to 2 years. Ongoing high blood pressure should be treated with medicines if weight loss and exercise are not effective.  If you are 55 to 53 years old, ask your caregiver if you should take aspirin to prevent strokes.  Diabetes screening involves taking a blood sample to check your fasting blood sugar level. This should be done once every 3 years, after age 45, if you are within normal weight and without risk factors for diabetes. Testing should be considered at a younger age or be carried out more frequently if you are overweight and have at least 1 risk factor for diabetes.  Breast cancer screening is essential preventive care for women. You should practice "breast self-awareness." This means understanding the normal appearance and feel of your breasts and may include breast self-examination. Any changes detected, no matter how small, should be reported to a caregiver. Women in their 20s and 30s should have a clinical breast exam (CBE) by a caregiver as part of a regular health exam every 1 to 3 years. After age 40, women should have a CBE every year. Starting at age 40, women should consider having a mammography (breast X-ray test) every year. Women who have a family history of breast cancer should talk to their caregiver about genetic screening. Women at a high risk of breast cancer should talk to their caregivers about having magnetic resonance imaging (MRI) and a mammography every year.  Breast cancer gene (BRCA)-related cancer risk assessment is recommended for women who have family members with BRCA-related cancers. BRCA-related cancers include breast, ovarian, tubal, and peritoneal cancers. Having family members with these cancers may be associated with an increased risk for harmful changes (mutations) in the breast cancer genes BRCA1 and BRCA2. Results of the assessment will determine the need for genetic counseling and BRCA1 and BRCA2 testing.  The Pap test is  a screening test for cervical cancer. A Pap test can show cell changes on the cervix that might become cervical cancer if left untreated. A Pap test is a procedure in which cells are obtained and examined from the lower end of the uterus (cervix).  Women should have a Pap test starting at age 21.  Between ages 21 and 29, Pap tests should be repeated every 2 years.  Beginning at age 30, you should have a Pap test every 3 years as long as the past 3 Pap tests have been normal.  Some women have medical problems that increase the chance of getting cervical cancer. Talk to your caregiver about these problems. It is especially important to talk to your caregiver if a new problem develops soon after your last Pap test. In these cases, your caregiver may recommend more frequent screening and Pap tests.  The above recommendations are the same for women who have or have not gotten the vaccine for human papillomavirus (HPV).  If you had a hysterectomy for a problem that was not cancer or a condition that could lead to cancer, then   you no longer need Pap tests. Even if you no longer need a Pap test, a regular exam is a good idea to make sure no other problems are starting.  If you are between ages 65 and 70, and you have had normal Pap tests going back 10 years, you no longer need Pap tests. Even if you no longer need a Pap test, a regular exam is a good idea to make sure no other problems are starting.  If you have had past treatment for cervical cancer or a condition that could lead to cancer, you need Pap tests and screening for cancer for at least 20 years after your treatment.  If Pap tests have been discontinued, risk factors (such as a new sexual partner) need to be reassessed to determine if screening should be resumed.  The HPV test is an additional test that may be used for cervical cancer screening. The HPV test looks for the virus that can cause the cell changes on the cervix. The cells collected  during the Pap test can be tested for HPV. The HPV test could be used to screen women aged 30 years and older, and should be used in women of any age who have unclear Pap test results. After the age of 30, women should have HPV testing at the same frequency as a Pap test.  Colorectal cancer can be detected and often prevented. Most routine colorectal cancer screening begins at the age of 50 and continues through age 75. However, your caregiver may recommend screening at an earlier age if you have risk factors for colon cancer. On a yearly basis, your caregiver may provide home test kits to check for hidden blood in the stool. Use of a small camera at the end of a tube, to directly examine the colon (sigmoidoscopy or colonoscopy), can detect the earliest forms of colorectal cancer. Talk to your caregiver about this at age 50, when routine screening begins. Direct examination of the colon should be repeated every 5 to 10 years through age 75, unless early forms of pre-cancerous polyps or small growths are found.  Hepatitis C blood testing is recommended for all people born from 1945 through 1965 and any individual with known risks for hepatitis C.  Practice safe sex. Use condoms and avoid high-risk sexual practices to reduce the spread of sexually transmitted infections (STIs). STIs include gonorrhea, chlamydia, syphilis, trichomonas, herpes, HPV, and human immunodeficiency virus (HIV). Herpes, HIV, and HPV are viral illnesses that have no cure. They can result in disability, cancer, and death. Sexually active women aged 25 and younger should be checked for chlamydia. Older women with new or multiple partners should also be tested for chlamydia. Testing for other STIs is recommended if you are sexually active and at increased risk.  Osteoporosis is a disease in which the bones lose minerals and strength with aging. This can result in serious bone fractures. The risk of osteoporosis can be identified using a  bone density scan. Women ages 65 and over and women at risk for fractures or osteoporosis should discuss screening with their caregivers. Ask your caregiver whether you should take a calcium supplement or vitamin D to reduce the rate of osteoporosis.  Menopause can be associated with physical symptoms and risks. Hormone replacement therapy is available to decrease symptoms and risks. You should talk to your caregiver about whether hormone replacement therapy is right for you.  Use sunscreen. Apply sunscreen liberally and repeatedly throughout the day. You should seek shade   when your shadow is shorter than you. Protect yourself by wearing long sleeves, pants, a wide-brimmed hat, and sunglasses year round, whenever you are outdoors.  Once a month, do a whole body skin exam, using a mirror to look at the skin on your back. Notify your caregiver of new moles, moles that have irregular borders, moles that are larger than a pencil eraser, or moles that have changed in shape or color.  Stay current with required immunizations.  Influenza vaccine. All adults should be immunized every year.  Tetanus, diphtheria, and acellular pertussis (Td, Tdap) vaccine. Pregnant women should receive 1 dose of Tdap vaccine during each pregnancy. The dose should be obtained regardless of the length of time since the last dose. Immunization is preferred during the 27th to 36th week of gestation. An adult who has not previously received Tdap or who does not know her vaccine status should receive 1 dose of Tdap. This initial dose should be followed by tetanus and diphtheria toxoids (Td) booster doses every 10 years. Adults with an unknown or incomplete history of completing a 3-dose immunization series with Td-containing vaccines should begin or complete a primary immunization series including a Tdap dose. Adults should receive a Td booster every 10 years.  Varicella vaccine. An adult without evidence of immunity to varicella  should receive 2 doses or a second dose if she has previously received 1 dose. Pregnant females who do not have evidence of immunity should receive the first dose after pregnancy. This first dose should be obtained before leaving the health care facility. The second dose should be obtained 4 8 weeks after the first dose.  Human papillomavirus (HPV) vaccine. Females aged 13 26 years who have not received the vaccine previously should obtain the 3-dose series. The vaccine is not recommended for use in pregnant females. However, pregnancy testing is not needed before receiving a dose. If a female is found to be pregnant after receiving a dose, no treatment is needed. In that case, the remaining doses should be delayed until after the pregnancy. Immunization is recommended for any person with an immunocompromised condition through the age of 26 years if she did not get any or all doses earlier. During the 3-dose series, the second dose should be obtained 4 8 weeks after the first dose. The third dose should be obtained 24 weeks after the first dose and 16 weeks after the second dose.  Zoster vaccine. One dose is recommended for adults aged 60 years or older unless certain conditions are present.  Measles, mumps, and rubella (MMR) vaccine. Adults born before 1957 generally are considered immune to measles and mumps. Adults born in 1957 or later should have 1 or more doses of MMR vaccine unless there is a contraindication to the vaccine or there is laboratory evidence of immunity to each of the three diseases. A routine second dose of MMR vaccine should be obtained at least 28 days after the first dose for students attending postsecondary schools, health care workers, or international travelers. People who received inactivated measles vaccine or an unknown type of measles vaccine during 1963 1967 should receive 2 doses of MMR vaccine. People who received inactivated mumps vaccine or an unknown type of mumps vaccine  before 1979 and are at high risk for mumps infection should consider immunization with 2 doses of MMR vaccine. For females of childbearing age, rubella immunity should be determined. If there is no evidence of immunity, females who are not pregnant should be vaccinated. If there   is no evidence of immunity, females who are pregnant should delay immunization until after pregnancy. Unvaccinated health care workers born before 1957 who lack laboratory evidence of measles, mumps, or rubella immunity or laboratory confirmation of disease should consider measles and mumps immunization with 2 doses of MMR vaccine or rubella immunization with 1 dose of MMR vaccine.  Pneumococcal 13-valent conjugate (PCV13) vaccine. When indicated, a person who is uncertain of her immunization history and has no record of immunization should receive the PCV13 vaccine. An adult aged 19 years or older who has certain medical conditions and has not been previously immunized should receive 1 dose of PCV13 vaccine. This PCV13 should be followed with a dose of pneumococcal polysaccharide (PPSV23) vaccine. The PPSV23 vaccine dose should be obtained at least 8 weeks after the dose of PCV13 vaccine. An adult aged 19 years or older who has certain medical conditions and previously received 1 or more doses of PPSV23 vaccine should receive 1 dose of PCV13. The PCV13 vaccine dose should be obtained 1 or more years after the last PPSV23 vaccine dose.  Pneumococcal polysaccharide (PPSV23) vaccine. When PCV13 is also indicated, PCV13 should be obtained first. All adults aged 65 years and older should be immunized. An adult younger than age 65 years who has certain medical conditions should be immunized. Any person who resides in a nursing home or long-term care facility should be immunized. An adult smoker should be immunized. People with an immunocompromised condition and certain other conditions should receive both PCV13 and PPSV23 vaccines. People  with human immunodeficiency virus (HIV) infection should be immunized as soon as possible after diagnosis. Immunization during chemotherapy or radiation therapy should be avoided. Routine use of PPSV23 vaccine is not recommended for American Indians, [place] Natives, or people younger than 65 years unless there are medical conditions that require PPSV23 vaccine. When indicated, people who have unknown immunization and have no record of immunization should receive PPSV23 vaccine. One-time revaccination 5 years after the first dose of PPSV23 is recommended for people aged 19 64 years who have chronic kidney failure, nephrotic syndrome, asplenia, or immunocompromised conditions. People who received 1 2 doses of PPSV23 before age 65 years should receive another dose of PPSV23 vaccine at age 65 years or later if at least 5 years have passed since the previous dose. Doses of PPSV23 are not needed for people immunized with PPSV23 at or after age 65 years.  Meningococcal vaccine. Adults with asplenia or persistent complement component deficiencies should receive 2 doses of quadrivalent meningococcal conjugate (MenACWY-D) vaccine. The doses should be obtained at least 2 months apart. Microbiologists working with certain meningococcal bacteria, military recruits, people at risk during an outbreak, and people who travel to or live in countries with a high rate of meningitis should be immunized. A first-year college student up through age 21 years who is living in a residence hall should receive a dose if she did not receive a dose on or after her 16th birthday. Adults who have certain high-risk conditions should receive one or more doses of vaccine.  Hepatitis A vaccine. Adults who wish to be protected from this disease, have certain high-risk conditions, work with hepatitis A-infected animals, work in hepatitis A research labs, or travel to or work in countries with a high rate of hepatitis A should be immunized. Adults  who were previously unvaccinated and who anticipate close contact with an international adoptee during the first 60 days after arrival in the [place] from a country   with a high rate of hepatitis A should be immunized.  Hepatitis B vaccine. Adults who wish to be protected from this disease, have certain high-risk conditions, may be exposed to blood or other infectious body fluids, are household contacts or sex partners of hepatitis B positive people, are clients or workers in certain care facilities, or travel to or work in countries with a high rate of hepatitis B should be immunized.  Haemophilus influenzae type b (Hib) vaccine. A previously unvaccinated person with asplenia or sickle cell disease or having a scheduled splenectomy should receive 1 dose of Hib vaccine. Regardless of previous immunization, a recipient of a hematopoietic stem cell transplant should receive a 3-dose series 6 12 months after her successful transplant. Hib vaccine is not recommended for adults with HIV infection. Preventive Services / Frequency Ages 19 to 39  Blood pressure check.** / Every 1 to 2 years.  Lipid and cholesterol check.** / Every 5 years beginning at age 20.  Clinical breast exam.** / Every 3 years for women in their 20s and 30s.  BRCA-related cancer risk assessment.** / For women who have family members with a BRCA-related cancer (breast, ovarian, tubal, or peritoneal cancers).  Pap test.** / Every 2 years from ages 21 through 29. Every 3 years starting at age 30 through age 65 or 70 with a history of 3 consecutive normal Pap tests.  HPV screening.** / Every 3 years from ages 30 through ages 65 to 70 with a history of 3 consecutive normal Pap tests.  Hepatitis C blood test.** / For any individual with known risks for hepatitis C.  Skin self-exam. / Monthly.  Influenza vaccine. / Every year.  Tetanus, diphtheria, and acellular pertussis (Tdap, Td) vaccine.** / Consult your caregiver. Pregnant  women should receive 1 dose of Tdap vaccine during each pregnancy. 1 dose of Td every 10 years.  Varicella vaccine.** / Consult your caregiver. Pregnant females who do not have evidence of immunity should receive the first dose after pregnancy.  HPV vaccine. / 3 doses over 6 months, if 26 and younger. The vaccine is not recommended for use in pregnant females. However, pregnancy testing is not needed before receiving a dose.  Measles, mumps, rubella (MMR) vaccine.** / You need at least 1 dose of MMR if you were born in 1957 or later. You may also need a 2nd dose. For females of childbearing age, rubella immunity should be determined. If there is no evidence of immunity, females who are not pregnant should be vaccinated. If there is no evidence of immunity, females who are pregnant should delay immunization until after pregnancy.  Pneumococcal 13-valent conjugate (PCV13) vaccine.** / Consult your caregiver.  Pneumococcal polysaccharide (PPSV23) vaccine.** / 1 to 2 doses if you smoke cigarettes or if you have certain conditions.  Meningococcal vaccine.** / 1 dose if you are age 19 to 21 years and a first-year college student living in a residence hall, or have one of several medical conditions, you need to get vaccinated against meningococcal disease. You may also need additional booster doses.  Hepatitis A vaccine.** / Consult your caregiver.  Hepatitis B vaccine.** / Consult your caregiver.  Haemophilus influenzae type b (Hib) vaccine.** / Consult your caregiver. Ages 40 to 64  Blood pressure check.** / Every 1 to 2 years.  Lipid and cholesterol check.** / Every 5 years beginning at age 20.  Lung cancer screening. / Every year if you are aged 55 80 years and have a 30-pack-year history of smoking and   currently smoke or have quit within the past 15 years. Yearly screening is stopped once you have quit smoking for at least 15 years or develop a health problem that would prevent you from having  lung cancer treatment.  Clinical breast exam.** / Every year after age 40.  BRCA-related cancer risk assessment.** / For women who have family members with a BRCA-related cancer (breast, ovarian, tubal, or peritoneal cancers).  Mammogram.** / Every year beginning at age 40 and continuing for as long as you are in good health. Consult with your caregiver.  Pap test.** / Every 3 years starting at age 30 through age 65 or 70 with a history of 3 consecutive normal Pap tests.  HPV screening.** / Every 3 years from ages 30 through ages 65 to 70 with a history of 3 consecutive normal Pap tests.  Fecal occult blood test (FOBT) of stool. / Every year beginning at age 50 and continuing until age 75. You may not need to do this test if you get a colonoscopy every 10 years.  Flexible sigmoidoscopy or colonoscopy.** / Every 5 years for a flexible sigmoidoscopy or every 10 years for a colonoscopy beginning at age 50 and continuing until age 75.  Hepatitis C blood test.** / For all people born from 1945 through 1965 and any individual with known risks for hepatitis C.  Skin self-exam. / Monthly.  Influenza vaccine. / Every year.  Tetanus, diphtheria, and acellular pertussis (Tdap/Td) vaccine.** / Consult your caregiver. Pregnant women should receive 1 dose of Tdap vaccine during each pregnancy. 1 dose of Td every 10 years.  Varicella vaccine.** / Consult your caregiver. Pregnant females who do not have evidence of immunity should receive the first dose after pregnancy.  Zoster vaccine.** / 1 dose for adults aged 60 years or older.  Measles, mumps, rubella (MMR) vaccine.** / You need at least 1 dose of MMR if you were born in 1957 or later. You may also need a 2nd dose. For females of childbearing age, rubella immunity should be determined. If there is no evidence of immunity, females who are not pregnant should be vaccinated. If there is no evidence of immunity, females who are pregnant should delay  immunization until after pregnancy.  Pneumococcal 13-valent conjugate (PCV13) vaccine.** / Consult your caregiver.  Pneumococcal polysaccharide (PPSV23) vaccine.** / 1 to 2 doses if you smoke cigarettes or if you have certain conditions.  Meningococcal vaccine.** / Consult your caregiver.  Hepatitis A vaccine.** / Consult your caregiver.  Hepatitis B vaccine.** / Consult your caregiver.  Haemophilus influenzae type b (Hib) vaccine.** / Consult your caregiver. Ages 65 and over  Blood pressure check.** / Every 1 to 2 years.  Lipid and cholesterol check.** / Every 5 years beginning at age 20.  Lung cancer screening. / Every year if you are aged 55 80 years and have a 30-pack-year history of smoking and currently smoke or have quit within the past 15 years. Yearly screening is stopped once you have quit smoking for at least 15 years or develop a health problem that would prevent you from having lung cancer treatment.  Clinical breast exam.** / Every year after age 40.  BRCA-related cancer risk assessment.** / For women who have family members with a BRCA-related cancer (breast, ovarian, tubal, or peritoneal cancers).  Mammogram.** / Every year beginning at age 40 and continuing for as long as you are in good health. Consult with your caregiver.  Pap test.** / Every 3 years starting at age   30 through age 65 or 70 with a 3 consecutive normal Pap tests. Testing can be stopped between 65 and 70 with 3 consecutive normal Pap tests and no abnormal Pap or HPV tests in the past 10 years.  HPV screening.** / Every 3 years from ages 30 through ages 65 or 70 with a history of 3 consecutive normal Pap tests. Testing can be stopped between 65 and 70 with 3 consecutive normal Pap tests and no abnormal Pap or HPV tests in the past 10 years.  Fecal occult blood test (FOBT) of stool. / Every year beginning at age 50 and continuing until age 75. You may not need to do this test if you get a colonoscopy  every 10 years.  Flexible sigmoidoscopy or colonoscopy.** / Every 5 years for a flexible sigmoidoscopy or every 10 years for a colonoscopy beginning at age 50 and continuing until age 75.  Hepatitis C blood test.** / For all people born from 1945 through 1965 and any individual with known risks for hepatitis C.  Osteoporosis screening.** / A one-time screening for women ages 65 and over and women at risk for fractures or osteoporosis.  Skin self-exam. / Monthly.  Influenza vaccine. / Every year.  Tetanus, diphtheria, and acellular pertussis (Tdap/Td) vaccine.** / 1 dose of Td every 10 years.  Varicella vaccine.** / Consult your caregiver.  Zoster vaccine.** / 1 dose for adults aged 60 years or older.  Pneumococcal 13-valent conjugate (PCV13) vaccine.** / Consult your caregiver.  Pneumococcal polysaccharide (PPSV23) vaccine.** / 1 dose for all adults aged 65 years and older.  Meningococcal vaccine.** / Consult your caregiver.  Hepatitis A vaccine.** / Consult your caregiver.  Hepatitis B vaccine.** / Consult your caregiver.  Haemophilus influenzae type b (Hib) vaccine.** / Consult your caregiver. ** Family history and personal history of risk and conditions may change your caregiver's recommendations. Document Released: 11/10/2001 Document Revised: 01/09/2013 Document Reviewed: 02/09/2011 Exit[employer] Patient Information 2014 [employer].  

## 2013-09-08 NOTE — Progress Notes (Signed)
Subjective:     Kathy Watkins is a 52 y.o. female and is here for a comprehensive physical exam. The patient reports no problems.  History   Social History  . Marital Status: Married    Spouse Name: N/A    Number of Children: N/A  . Years of Education: N/A   Occupational History  . Not on file.   Social History Main Topics  . Smoking status: Never Smoker   . Smokeless tobacco: Never Used  . Alcohol Use: 1.2 oz/week    2 Glasses of wine per week  . Drug Use: No  . Sexual Activity: Yes    Partners: Male   Other Topics Concern  . Not on file   Social History Narrative   Exercise-- walks 5 nights a week 3 miles   Health Maintenance  Topic Date Due  . Influenza Vaccine  04/28/2013  . Pap Smear  06/28/2014  . Mammogram  08/19/2015  . Colonoscopy  10/21/2015  . Tetanus/tdap  11/06/2017    The following portions of the patient's history were reviewed and updated as appropriate:  She  has a past medical history of Hypertension and Hyperlipidemia. She  does not have any pertinent problems on file. She  has past surgical history that includes Abdominal hysterectomy (1994); Breast surgery (2008); and Parotid gland tumor excision (2000). Her family history includes Cancer (age of onset: 49) in her mother; Hyperlipidemia in her father and sister; Hypertension in her father, mother, and sister; Kidney disease in her father. She  reports that she has never smoked. She has never used smokeless tobacco. She reports that she drinks about 1.2 ounces of alcohol per week. She reports that she does not use illicit drugs. She has a current medication list which includes the following prescription(s): alprazolam, amlodipine, hydrochlorothiazide, and rosuvastatin. Current Outpatient Prescriptions on File Prior to Visit  Medication Sig Dispense Refill  . ALPRAZolam (XANAX) 0.25 MG tablet TAKE 1 TABLET BY MOUTH 3 TIMES A DAY AS NEEDED  60 tablet  0  . amLODipine (NORVASC) 10 MG tablet Take  1 tablet (10 mg total) by mouth daily.  90 tablet  1  . hydrochlorothiazide (HYDRODIURIL) 25 MG tablet Take 1 tablet (25 mg total) by mouth daily.  90 tablet  1  . rosuvastatin (CRESTOR) 5 MG tablet TAKE 1 TABLET EVERY OTHER DAY       No current facility-administered medications on file prior to visit.   She has No Known Allergies..  Review of Systems Review of Systems  Constitutional: Negative for activity change, appetite change and fatigue.  HENT: Negative for hearing loss, congestion, tinnitus and ear discharge.  dentist q63m Eyes: Negative for visual disturbance (see optho -due) Respiratory: Negative for cough, chest tightness and shortness of breath.   Cardiovascular: Negative for chest pain, palpitations and leg swelling.  Gastrointestinal: Negative for abdominal pain, diarrhea, constipation and abdominal distention.  Genitourinary: Negative for urgency, frequency, decreased urine volume and difficulty urinating.  Musculoskeletal: Negative for back pain, arthralgias and gait problem.  Skin: Negative for color change, pallor and rash.  Neurological: Negative for dizziness, light-headedness, numbness and headaches.  Hematological: Negative for adenopathy. Does not bruise/bleed easily.  Psychiatric/Behavioral: Negative for suicidal ideas, confusion, sleep disturbance, self-injury, dysphoric mood, decreased concentration and agitation.       Objective:    BP 118/74  Pulse 87  Temp(Src) 97.7 F (36.5 C) (Oral)  Ht 5' 5.5" (1.664 m)  Wt 169 lb (76.658 kg)  BMI 27.69  kg/m2  SpO2 98% General appearance: alert, cooperative, appears stated age and no distress Head: Normocephalic, without obvious abnormality, atraumatic Eyes: conjunctivae/corneas clear. PERRL, EOM's intact. Fundi benign. Ears: normal TM's and external ear canals both ears Nose: Nares normal. Septum midline. Mucosa normal. No drainage or sinus tenderness. Throat: lips, mucosa, and tongue normal; teeth and gums  normal Neck: no adenopathy, no carotid bruit, no JVD, supple, symmetrical, trachea midline and thyroid not enlarged, symmetric, no tenderness/mass/nodules Back: symmetric, no curvature. ROM normal. No CVA tenderness. Lungs: clear to auscultation bilaterally Breasts: gyn Heart: regular rate and rhythm, S1, S2 normal, no murmur, click, rub or gallop Abdomen: soft, non-tender; bowel sounds normal; no masses,  no organomegaly Pelvic: deferred --gyn Extremities: extremities normal, atraumatic, no cyanosis or edema Pulses: 2+ and symmetric Skin: Skin color, texture, turgor normal. No rashes or lesions Lymph nodes: Cervical, supraclavicular, and axillary nodes normal. Neurologic: Alert and oriented X 3, normal strength and tone. Normal symmetric reflexes. Normal coordination and gait Psych- no depression, no anxiety      Assessment:    Healthy female exam.      Plan:  ghm utd Check labs   See After Visit Summary for Counseling Recommendations

## 2013-09-08 NOTE — Progress Notes (Signed)
Pre visit review using our clinic review tool, if applicable. No additional management support is needed unless otherwise documented below in the visit note. 

## 2013-09-08 NOTE — Telephone Encounter (Signed)
Called and confirmed 09/14/13 genetic appt w/ pt.  Unable to schedule the appt for [name] so I have emailed Melissa to either add it or open the template so I can.  Emailed [name], Dr. Welton Flakes and [facility] to make them aware.

## 2013-09-09 NOTE — Assessment & Plan Note (Signed)
Stable con't meds 

## 2013-09-09 NOTE — Assessment & Plan Note (Signed)
Check labs con't meds 

## 2013-09-09 NOTE — Assessment & Plan Note (Signed)
Per gyn, surgeon and oncology

## 2013-09-11 ENCOUNTER — Other Ambulatory Visit (INDEPENDENT_AMBULATORY_CARE_PROVIDER_SITE_OTHER): Payer: BC Managed Care – PPO

## 2013-09-11 DIAGNOSIS — E785 Hyperlipidemia, unspecified: Secondary | ICD-10-CM

## 2013-09-11 DIAGNOSIS — Z0000 Encounter for general adult medical examination without abnormal findings: Secondary | ICD-10-CM

## 2013-09-11 LAB — CBC WITH DIFFERENTIAL/PLATELET
Basophils Absolute: 0.1 10*3/uL (ref 0.0–0.1)
Eosinophils Absolute: 0.2 10*3/uL (ref 0.0–0.7)
HCT: 45.8 % (ref 36.0–46.0)
Hemoglobin: 15.4 g/dL — ABNORMAL HIGH (ref 12.0–15.0)
Lymphs Abs: 2.6 10*3/uL (ref 0.7–4.0)
MCHC: 33.6 g/dL (ref 30.0–36.0)
Neutro Abs: 5.7 10*3/uL (ref 1.4–7.7)
Neutrophils Relative %: 62 % (ref 43.0–77.0)
RDW: 12.5 % (ref 11.5–14.6)

## 2013-09-11 LAB — BASIC METABOLIC PANEL
BUN: 13 mg/dL (ref 6–23)
CO2: 26 mEq/L (ref 19–32)
Creatinine, Ser: 1 mg/dL (ref 0.4–1.2)
GFR: 63.69 mL/min (ref >60.00)
Glucose, Bld: 90 mg/dL (ref 70–99)
Potassium: 3.9 mEq/L (ref 3.5–5.1)
Sodium: 140 mEq/L (ref 135–145)

## 2013-09-11 LAB — LIPID PANEL
Cholesterol: 211 mg/dL — ABNORMAL HIGH (ref 0–200)
HDL: 51.1 mg/dL (ref >39.00)
VLDL: 19 mg/dL (ref 0.0–40.0)

## 2013-09-11 LAB — HEPATIC FUNCTION PANEL
ALT: 36 U/L — ABNORMAL HIGH (ref 0–35)
Albumin: 4.2 g/dL (ref 3.5–5.2)
Alkaline Phosphatase: 66 U/L (ref 39–117)
Total Bilirubin: 0.5 mg/dL (ref 0.3–1.2)

## 2013-09-11 LAB — LDL CHOLESTEROL, DIRECT: Direct LDL: 151 mg/dL

## 2013-09-13 LAB — POCT URINALYSIS DIPSTICK
Blood, UA: NEGATIVE
Glucose, UA: NEGATIVE
Ketones, UA: NEGATIVE
Leukocytes, UA: NEGATIVE
Protein, UA: NEGATIVE
Spec Grav, UA: 1.01
Urobilinogen, UA: 0.2

## 2013-09-14 ENCOUNTER — Ambulatory Visit (HOSPITAL_BASED_OUTPATIENT_CLINIC_OR_DEPARTMENT_OTHER): Payer: BC Managed Care – PPO | Admitting: Genetic Counselor

## 2013-09-14 ENCOUNTER — Other Ambulatory Visit: Payer: BC Managed Care – PPO

## 2013-09-14 ENCOUNTER — Encounter: Payer: Self-pay | Admitting: Genetic Counselor

## 2013-09-14 DIAGNOSIS — K635 Polyp of colon: Secondary | ICD-10-CM | POA: Insufficient documentation

## 2013-09-14 DIAGNOSIS — C50919 Malignant neoplasm of unspecified site of unspecified female breast: Secondary | ICD-10-CM

## 2013-09-14 DIAGNOSIS — Z803 Family history of malignant neoplasm of breast: Secondary | ICD-10-CM

## 2013-09-14 DIAGNOSIS — D0501 Lobular carcinoma in situ of right breast: Secondary | ICD-10-CM

## 2013-09-14 DIAGNOSIS — Z808 Family history of malignant neoplasm of other organs or systems: Secondary | ICD-10-CM

## 2013-09-14 DIAGNOSIS — Z800 Family history of malignant neoplasm of digestive organs: Secondary | ICD-10-CM

## 2013-09-14 NOTE — Progress Notes (Signed)
Dr.  Drue Second requested a consultation for genetic counseling and risk assessment for Kathy Watkins, a 52 y.o. female, for discussion of her personal history of LCIS and family history of breast, colon and prostate cancer and melanoma.  She presents to clinic today to discuss the possibility of a genetic predisposition to cancer, and to further clarify her risks, as well as her family members' risks for cancer.   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 She is trying to determine between a single vs. Double mastectomy.  She has a history of lumpectomy in 2008 that was benign, and a paratid gland non-cancerous tumor in 2000.  She had a colonoscopy and found 4-5 polyps, and is now on an increased screening schedule.    Past Medical History  Diagnosis Date  . Hypertension   . Hyperlipidemia   . Colon polyps     Past Surgical History  Procedure Laterality Date  . Abdominal hysterectomy  1994    adenomyosis  . Breast surgery  2008    lumpectomy  . Parotid gland tumor excision  2000    History   Social History  . Marital Status: Married    Spouse Name: N/A    Number of Children: 2  . Years of Education: N/A   Occupational History  . [employer].    Social History Main Topics  . Smoking status: Never Smoker   . Smokeless tobacco: Never Used  . Alcohol Use: 1.2 oz/week    2 Glasses of wine per week  . Drug Use: No  . Sexual Activity: Yes    Partners: Male   Other Topics Concern  . Not on file   Social History Narrative   Exercise-- walks 5 nights a week 3 miles    REPRODUCTIVE HISTORY AND PERSONAL RISK ASSESSMENT FACTORS: Menarche was at age 44.   perimenopausal Uterus Intact: no Ovaries Intact: yes G2P2A0, first live birth at age 44  She has not previously undergone treatment for infertility.   Oral Contraceptive use: 8 years   She has not used HRT in the past.    FAMILY HISTORY:   We obtained a detailed, 4-generation family history.  Significant diagnoses are listed below: Family History  Problem Relation Age of Onset  . Hypertension Mother   . Breast cancer Mother 68  . Kidney disease Father   . Hypertension Father   . Hyperlipidemia Father   . Prostate cancer Father 37  . Hypertension Sister   . Hyperlipidemia Sister   . Colon polyps Sister   . Heart attack Maternal Uncle   . Melanoma Paternal Aunt 70    on leg  . Lung cancer Maternal Grandmother     smoker  . Colon cancer Paternal Grandmother 78  . Stroke Paternal Grandfather 28    Patient's maternal ancestors are of [place] and [place] descent, and paternal ancestors are of [place] and [place] descent. There is no reported Ashkenazi Jewish ancestry. There is no known consanguinity.  GENETIC COUNSELING ASSESSMENT: Kathy Watkins is a 52 y.o. female with a personal history of LCIS and family history of breast, prostate and colon cancer and melanoma which somewhat suggestive of a hereditary cancer syndrome and predisposition to cancer. We, therefore, discussed and recommended the following at today's visit.   DISCUSSION: We reviewed the characteristics, features and inheritance patterns of hereditary cancer syndromes. We also discussed genetic testing,  including the appropriate family members to test, the process of testing, insurance coverage and turn-around-time for results. We will send to [facility] hereditary cancer panel.  In order to estimate her chance of having a BRCA mutation, we used statistical models (Penn II and Myriad calculator) and laboratory data that take into account her personal medical history, family history and ancestry.  Because each model is different, there can be a lot of variability in the risks they give.  Therefore, these numbers must be considered a rough range and not a precise risk of having a BRCA mutation.  These models estimate that she has approximately a  2.2-7% chance of having a mutation. Based on this assessment of her family and personal history, genetic testing is recommended.  PLAN: After considering the risks, benefits, and limitations, Kathy Watkins provided informed consent to pursue genetic testing and the blood sample will be sent to [facility] for analysis of the [facility] hereditary cancer panel. We discussed the implications of a positive, negative and/ or variant of uncertain significance genetic test result. Results should be available within approximately 3 weeks' time, at which point they will be disclosed by telephone to Kathy Watkins, as will any additional recommendations warranted by these results. Kathy Watkins will receive a summary of her genetic counseling visit and a copy of her results once available. This information will also be available in Epic. We encouraged Kathy Watkins to remain in contact with cancer genetics annually so that we can continuously update the family history and inform her of any changes in cancer genetics and testing that may be of benefit for her family. Kathy Watkins's questions were answered to her satisfaction today. Our contact information was provided should additional questions or concerns arise.  The patient was seen for a total of 60 minutes, greater than 50% of which was spent face-to-face counseling.  This note will also be sent to the referring provider via the electronic medical record. The patient will be supplied with a summary of this genetic counseling discussion as well as educational information on the discussed hereditary cancer syndromes following the conclusion of their visit.   Patient was discussed with Dr. Drue Second.   _______________________________________________________________________ For Office Staff:  Number of people involved in session: 1 Was an Intern/ student involved with case: no

## 2013-09-18 ENCOUNTER — Encounter: Payer: Self-pay | Admitting: Family Medicine

## 2013-09-22 ENCOUNTER — Encounter (INDEPENDENT_AMBULATORY_CARE_PROVIDER_SITE_OTHER): Payer: Self-pay | Admitting: General Surgery

## 2013-09-22 ENCOUNTER — Other Ambulatory Visit (INDEPENDENT_AMBULATORY_CARE_PROVIDER_SITE_OTHER): Payer: Self-pay | Admitting: General Surgery

## 2013-09-22 ENCOUNTER — Ambulatory Visit (INDEPENDENT_AMBULATORY_CARE_PROVIDER_SITE_OTHER): Payer: BC Managed Care – PPO | Admitting: General Surgery

## 2013-09-22 VITALS — BP 138/90 | HR 80 | Temp 98.6°F | Resp 14 | Ht 65.0 in | Wt 170.0 lb

## 2013-09-22 DIAGNOSIS — C50919 Malignant neoplasm of unspecified site of unspecified female breast: Secondary | ICD-10-CM

## 2013-09-22 DIAGNOSIS — C50911 Malignant neoplasm of unspecified site of right female breast: Secondary | ICD-10-CM

## 2013-09-22 DIAGNOSIS — Z9889 Other specified postprocedural states: Secondary | ICD-10-CM

## 2013-09-22 DIAGNOSIS — D0501 Lobular carcinoma in situ of right breast: Secondary | ICD-10-CM

## 2013-09-22 NOTE — Progress Notes (Signed)
Patient ID: Kathy Watkins, female   DOB: Apr 28, 1961, 52 y.o.   MRN: [id number]  Chief Complaint  Patient presents with  . Establish Care    rt breast     HPI Kathy Watkins is a 52 y.o. female.  We're asked to see the patient in consultation by Dr. Konrad Saha To evaluate her for an abnormality in the Right breast. The patient had a recent mammogram that was negative. Because she has dense breast tissue and a family history of breast cancer she also underwent an MRI study. The MRI showed an area of enhancement in the lateral aspect of the right breast. This was biopsied and came back as LCIS. She has no breast pain. She has no discharge from her nipple. HPI  Past Medical History  Diagnosis Date  . Hypertension   . Hyperlipidemia   . Colon polyps     Past Surgical History  Procedure Laterality Date  . Abdominal hysterectomy  1994    adenomyosis  . Breast surgery  2008    lumpectomy  . Parotid gland tumor excision  2000    Family History  Problem Relation Age of Onset  . Hypertension Mother   . Breast cancer Mother 32  . Kidney disease Father   . Hypertension Father   . Hyperlipidemia Father   . Prostate cancer Father 49  . Hypertension Sister   . Hyperlipidemia Sister   . Colon polyps Sister   . Heart attack Maternal Uncle   . Melanoma Paternal Aunt 70    on leg  . Lung cancer Maternal Grandmother     smoker  . Colon cancer Paternal Grandmother 72  . Stroke Paternal Grandfather 70    Social History History  Substance Use Topics  . Smoking status: Never Smoker   . Smokeless tobacco: Never Used  . Alcohol Use: 1.2 oz/week    2 Glasses of wine per week    No Known Allergies  Current Outpatient Prescriptions  Medication Sig Dispense Refill  . ALPRAZolam (XANAX) 0.25 MG tablet TAKE 1 TABLET BY MOUTH 3 TIMES A DAY AS NEEDED  60 tablet  0  . amLODipine (NORVASC) 10 MG tablet Take 1 tablet (10 mg total) by mouth daily.  90 tablet  1  . hydrochlorothiazide  (HYDRODIURIL) 25 MG tablet Take 1 tablet (25 mg total) by mouth daily.  90 tablet  1  . rosuvastatin (CRESTOR) 5 MG tablet TAKE 1 TABLET EVERY OTHER DAY       No current facility-administered medications for this visit.    Review of Systems Review of Systems  Constitutional: Negative.   HENT: Negative.   Eyes: Negative.   Respiratory: Negative.   Cardiovascular: Negative.   Gastrointestinal: Negative.   Endocrine: Negative.   Genitourinary: Negative.   Musculoskeletal: Negative.   Skin: Negative.   Allergic/Immunologic: Negative.   Neurological: Negative.   Hematological: Negative.   Psychiatric/Behavioral: Negative.     Blood pressure 120/74, pulse 67, temperature 98 F (36.7 C), resp. rate 18, height 5\' 5"  (1.651 m), weight 170 lb (77.111 kg).  Physical Exam Physical Exam  Constitutional: She is oriented to person, place, and time. She appears well-developed and well-nourished.  HENT:  Head: Normocephalic and atraumatic.  Eyes: Conjunctivae and EOM are normal. Pupils are equal, round, and reactive to light.  Neck: Normal range of motion. Neck supple.  Cardiovascular: Normal rate, regular rhythm and normal heart sounds.   Pulmonary/Chest: Effort normal and breath sounds normal.  There is no  palpable mass in either breast. There is no palpable axillary, supraclavicular, or cervical lymphadenopathy.  Abdominal: Soft. Bowel sounds are normal.  Musculoskeletal: Normal range of motion.  Lymphadenopathy:    She has no cervical adenopathy.  Neurological: She is alert and oriented to person, place, and time.  Skin: Skin is warm and dry.  Psychiatric: She has a normal mood and affect. Her behavior is normal.    Data Reviewed As above  Assessment    The patient appears to have an area of LCIS in the lateral right breast.The presence of LCIS posterior in a high-risk category for breast cancer on either side. Because of the small recommendation would be to re\re excise this  area to make sure that we are not missing something more significant. We will also refer her to the high-risk clinic to talk about her options for managing her risk     Plan    Plan for referral to the high-risk breast clinic. We will plan to see her back in about 3 or 4 weeks to go over the results of this evaluation and talk about her options again        TOTH III,Crescent Gotham S 09/22/2013, 8:37 AM

## 2013-09-22 NOTE — Patient Instructions (Signed)
Will refer to Dr. Odis Luster for reconstruction

## 2013-09-22 NOTE — Progress Notes (Signed)
Patient ID: Kathy Watkins, female   DOB: [birth year], 52 y.o.   MRN: [id number]  Chief Complaint  Patient presents with  . Routine Post Op    reck rt br    HPI Kathy Watkins is a 52 y.o. female.  The patient is a 52 year old white female who was seen recently with a right breast biopsy that showed LCIS. She has a strong family history of breast cancer. She has met with Dr. Welton Flakes in the high-risk clinic And also with the genetics counselor. She is very concerned about her risk of getting breast cancer. Because of this she is considering bilateral mastectomies with reconstructions. HPI  Past Medical History  Diagnosis Date  . Hypertension   . Hyperlipidemia   . Colon polyps     Past Surgical History  Procedure Laterality Date  . Abdominal hysterectomy  1994    adenomyosis  . Breast surgery  2008    lumpectomy  . Parotid gland tumor excision  2000    Family History  Problem Relation Age of Onset  . Hypertension Mother   . Breast cancer Mother 81  . Kidney disease Father   . Hypertension Father   . Hyperlipidemia Father   . Prostate cancer Father 40  . Hypertension Sister   . Hyperlipidemia Sister   . Colon polyps Sister   . Heart attack Maternal Uncle   . Melanoma Paternal Aunt 70    on leg  . Lung cancer Maternal Grandmother     smoker  . Colon cancer Paternal Grandmother 57  . Stroke Paternal Grandfather 43    Social History History  Substance Use Topics  . Smoking status: Never Smoker   . Smokeless tobacco: Never Used  . Alcohol Use: 1.2 oz/week    2 Glasses of wine per week    No Known Allergies  Current Outpatient Prescriptions  Medication Sig Dispense Refill  . ALPRAZolam (XANAX) 0.25 MG tablet TAKE 1 TABLET BY MOUTH 3 TIMES A DAY AS NEEDED  60 tablet  0  . amLODipine (NORVASC) 10 MG tablet Take 1 tablet (10 mg total) by mouth daily.  90 tablet  1  . hydrochlorothiazide (HYDRODIURIL) 25 MG tablet Take 1 tablet (25 mg total) by mouth daily.  90  tablet  1  . rosuvastatin (CRESTOR) 5 MG tablet 1 daily       No current facility-administered medications for this visit.    Review of Systems Review of Systems  Constitutional: Negative.   HENT: Negative.   Eyes: Negative.   Respiratory: Negative.   Cardiovascular: Negative.   Gastrointestinal: Negative.   Endocrine: Negative.   Genitourinary: Negative.   Musculoskeletal: Negative.   Skin: Negative.   Allergic/Immunologic: Negative.   Neurological: Negative.   Hematological: Negative.   Psychiatric/Behavioral: Negative.     Blood pressure 138/90, pulse 80, temperature 98.6 F (37 C), temperature source Temporal, resp. rate 14, height 5\' 5"  (1.651 m), weight 170 lb (77.111 kg).  Physical Exam Physical Exam  Constitutional: She is oriented to person, place, and time. She appears well-developed and well-nourished.  HENT:  Head: Normocephalic and atraumatic.  Eyes: Conjunctivae and EOM are normal. Pupils are equal, round, and reactive to light.  Neck: Normal range of motion. Neck supple.  Cardiovascular: Normal rate, regular rhythm and normal heart sounds.   Pulmonary/Chest: Effort normal and breath sounds normal.  There is no palpable mass in either breast. There is no palpable axillary, supraclavicular, or cervical lymphadenopathy  Abdominal: Soft. Bowel sounds  are normal.  Musculoskeletal: Normal range of motion.  Lymphadenopathy:    She has no cervical adenopathy.  Neurological: She is alert and oriented to person, place, and time.  Skin: Skin is warm and dry.  Psychiatric: She has a normal mood and affect. Her behavior is normal.    Data Reviewed As above  Assessment    The patient has a diagnosis of LCIS of the right breast and appears to have a pretty significantly increased risk of breast cancer in general. Because of this she is considering bilateral mastectomies with reconstructions. I think this is a very reasonable way to try to reduce her risk. I've  discussed with her in detail the risks and benefits of the operation to do this as well as some of the technical aspects and she understands.     Plan    At this point we will refer her to Dr. Odis Luster and plastic surgery and then begin planning for bilateral mastectomies with reconstruction        TOTH III,Shoshannah Faubert S 09/22/2013, 10:37 AM

## 2013-09-25 ENCOUNTER — Encounter (INDEPENDENT_AMBULATORY_CARE_PROVIDER_SITE_OTHER): Payer: Self-pay | Admitting: General Surgery

## 2013-09-25 ENCOUNTER — Encounter: Payer: Self-pay | Admitting: Oncology

## 2013-09-25 ENCOUNTER — Encounter (INDEPENDENT_AMBULATORY_CARE_PROVIDER_SITE_OTHER): Payer: Self-pay

## 2013-09-29 ENCOUNTER — Telehealth (INDEPENDENT_AMBULATORY_CARE_PROVIDER_SITE_OTHER): Payer: Self-pay | Admitting: General Surgery

## 2013-09-29 NOTE — Telephone Encounter (Signed)
Dr Harlow Mares would like to coordinate this case 1/22   No orders that day post call

## 2013-10-03 ENCOUNTER — Other Ambulatory Visit (INDEPENDENT_AMBULATORY_CARE_PROVIDER_SITE_OTHER): Payer: Self-pay | Admitting: General Surgery

## 2013-10-03 NOTE — Telephone Encounter (Signed)
Did orders today

## 2013-10-05 ENCOUNTER — Encounter: Payer: Self-pay | Admitting: Genetic Counselor

## 2013-10-05 ENCOUNTER — Telehealth: Payer: Self-pay | Admitting: Genetic Counselor

## 2013-10-05 NOTE — Telephone Encounter (Signed)
Revealed negative genetic testing.  The patient had requested a copy of her test results through the [facility] portal, so I released these after our discussion.

## 2013-10-18 ENCOUNTER — Encounter (HOSPITAL_COMMUNITY): Payer: Self-pay | Admitting: Pharmacy Technician

## 2013-10-20 ENCOUNTER — Other Ambulatory Visit: Payer: Self-pay | Admitting: Plastic Surgery

## 2013-10-23 ENCOUNTER — Encounter (HOSPITAL_COMMUNITY): Payer: Self-pay

## 2013-10-23 ENCOUNTER — Encounter (HOSPITAL_COMMUNITY)
Admission: RE | Admit: 2013-10-23 | Discharge: 2013-10-23 | Disposition: A | Discharge status: Home / Self Care | Payer: BC Managed Care – PPO | Source: Ambulatory Visit | Attending: General Surgery | Admitting: General Surgery

## 2013-10-23 ENCOUNTER — Encounter (HOSPITAL_COMMUNITY)
Admission: RE | Admit: 2013-10-23 | Discharge: 2013-10-23 | Disposition: A | Discharge status: Home / Self Care | Payer: BC Managed Care – PPO | Source: Ambulatory Visit | Attending: Anesthesiology | Admitting: Anesthesiology

## 2013-10-23 DIAGNOSIS — Z01810 Encounter for preprocedural cardiovascular examination: Secondary | ICD-10-CM | POA: Insufficient documentation

## 2013-10-23 DIAGNOSIS — Z01812 Encounter for preprocedural laboratory examination: Secondary | ICD-10-CM | POA: Insufficient documentation

## 2013-10-23 DIAGNOSIS — Z01811 Encounter for preprocedural respiratory examination: Secondary | ICD-10-CM | POA: Insufficient documentation

## 2013-10-23 DIAGNOSIS — Z01818 Encounter for other preprocedural examination: Secondary | ICD-10-CM | POA: Insufficient documentation

## 2013-10-23 HISTORY — DX: Lobular carcinoma in situ of unspecified breast: D05.00

## 2013-10-23 HISTORY — DX: Anxiety disorder, unspecified: F41.9

## 2013-10-23 HISTORY — DX: Other specified postprocedural states: R11.2

## 2013-10-23 HISTORY — DX: Nausea with vomiting, unspecified: Z98.890

## 2013-10-23 LAB — CBC
HCT: 43.9 % (ref 36.0–46.0)
Hemoglobin: 14.9 g/dL (ref 12.0–15.0)
MCH: 30.3 pg (ref 26.0–34.0)
MCHC: 33.9 g/dL (ref 30.0–36.0)
MCV: 89.2 fL (ref 78.0–100.0)
PLATELETS: 301 10*3/uL (ref 150–400)
RBC: 4.92 MIL/uL (ref 3.87–5.11)
RDW: 12.7 % (ref 11.5–15.5)
WBC: 7.4 10*3/uL (ref 4.0–10.5)

## 2013-10-23 LAB — BASIC METABOLIC PANEL
BUN: 9 mg/dL (ref 6–23)
CALCIUM: 8.9 mg/dL (ref 8.4–10.5)
CO2: 26 meq/L (ref 19–32)
CREATININE: 0.73 mg/dL (ref 0.50–1.10)
Chloride: 103 mEq/L (ref 96–112)
GFR calc Af Amer: >90 mL/min (ref >90)
GFR calc non Af Amer: >90 mL/min (ref >90)
GLUCOSE: 96 mg/dL (ref 70–99)
Potassium: 3.8 mEq/L (ref 3.7–5.3)
Sodium: 143 mEq/L (ref 137–147)

## 2013-10-23 NOTE — Pre-Procedure Instructions (Addendum)
Kathy Watkins  10/23/2013   Your procedure is scheduled on:  Monday, October 30, 2013 @ 7:30 AM  Report to [facility] (use Main Entrance "A'') at 5:30 AM.  Call this number if you have problems the morning of surgery: [phone]   Remember:   Do not eat food or drink liquids after midnight.   Take these medicines the morning of surgery with A SIP OF WATER: amLODipine (NORVASC) 10 MG tablet, if needed:ALPRAZolam (XANAX) 0.25 MG tablet for anxiety Stop taking Aspirin, vitamins and herbal medications. Do not take any NSAIDs ie: Ibuprofen, Advil, Naproxen or any medication containing Aspirin.  Do not wear jewelry, make-up or nail polish.  Do not wear lotions, powders, or perfumes. You may NOT wear deodorant.  Do not shave 48 hours prior to surgery.   Do not bring valuables to the hospital.  [facility] is not responsible for any belongings or valuables.               Contacts, dentures or bridgework may not be worn into surgery.  Leave suitcase in the car. After surgery it may be brought to your room.  For patients admitted to the hospital, discharge time is determined by your treatment team.               Patients discharged the day of surgery will not be allowed to drive home.  Name and phone number of your driver:  Special Instructions: Shower using CHG 2 nights before surgery and the night before surgery.  If you shower the day of surgery use CHG.  Use special wash - you have one bottle of CHG for all showers.  You should use approximately 1/3 of the bottle for each shower.   Please read over the following fact sheets that you were given: Pain Booklet, Coughing and Deep Breathing and Surgical Site Infection Prevention

## 2013-10-29 MED ORDER — CEFAZOLIN SODIUM-DEXTROSE 2-3 GM-% IV SOLR
2.0000 g | INTRAVENOUS | Status: AC
  Administered 2013-10-30 (×2): 2 g via INTRAVENOUS
  Filled 2013-10-29: qty 50

## 2013-10-30 ENCOUNTER — Encounter (HOSPITAL_COMMUNITY)
Admission: RE | Disposition: A | Discharge status: Home / Self Care | Payer: Self-pay | Source: Ambulatory Visit | Attending: Plastic Surgery

## 2013-10-30 ENCOUNTER — Inpatient Hospital Stay (HOSPITAL_COMMUNITY)
Admission: RE | Admit: 2013-10-30 | Discharge: 2013-11-01 | DRG: 583 | Disposition: A | Discharge status: Home / Self Care | Payer: BC Managed Care – PPO | Source: Ambulatory Visit | Attending: Plastic Surgery | Admitting: Plastic Surgery

## 2013-10-30 ENCOUNTER — Ambulatory Visit (HOSPITAL_COMMUNITY): Payer: BC Managed Care – PPO | Admitting: Anesthesiology

## 2013-10-30 ENCOUNTER — Encounter (HOSPITAL_COMMUNITY): Payer: Self-pay | Admitting: Anesthesiology

## 2013-10-30 ENCOUNTER — Encounter (HOSPITAL_COMMUNITY): Payer: BC Managed Care – PPO | Admitting: Anesthesiology

## 2013-10-30 DIAGNOSIS — Z7901 Long term (current) use of anticoagulants: Secondary | ICD-10-CM

## 2013-10-30 DIAGNOSIS — R110 Nausea: Secondary | ICD-10-CM | POA: Diagnosis not present

## 2013-10-30 DIAGNOSIS — Z801 Family history of malignant neoplasm of trachea, bronchus and lung: Secondary | ICD-10-CM

## 2013-10-30 DIAGNOSIS — D0500 Lobular carcinoma in situ of unspecified breast: Secondary | ICD-10-CM | POA: Diagnosis present

## 2013-10-30 DIAGNOSIS — E785 Hyperlipidemia, unspecified: Secondary | ICD-10-CM | POA: Diagnosis present

## 2013-10-30 DIAGNOSIS — Z8249 Family history of ischemic heart disease and other diseases of the circulatory system: Secondary | ICD-10-CM

## 2013-10-30 DIAGNOSIS — Z79899 Other long term (current) drug therapy: Secondary | ICD-10-CM

## 2013-10-30 DIAGNOSIS — I10 Essential (primary) hypertension: Secondary | ICD-10-CM | POA: Diagnosis present

## 2013-10-30 DIAGNOSIS — Z853 Personal history of malignant neoplasm of breast: Secondary | ICD-10-CM

## 2013-10-30 DIAGNOSIS — D0590 Unspecified type of carcinoma in situ of unspecified breast: Secondary | ICD-10-CM | POA: Diagnosis present

## 2013-10-30 DIAGNOSIS — Z800 Family history of malignant neoplasm of digestive organs: Secondary | ICD-10-CM

## 2013-10-30 DIAGNOSIS — Z808 Family history of malignant neoplasm of other organs or systems: Secondary | ICD-10-CM

## 2013-10-30 DIAGNOSIS — D4860 Neoplasm of uncertain behavior of unspecified breast: Secondary | ICD-10-CM

## 2013-10-30 DIAGNOSIS — R920 Mammographic microcalcification found on diagnostic imaging of breast: Secondary | ICD-10-CM

## 2013-10-30 DIAGNOSIS — Z823 Family history of stroke: Secondary | ICD-10-CM

## 2013-10-30 DIAGNOSIS — Z841 Family history of disorders of kidney and ureter: Secondary | ICD-10-CM

## 2013-10-30 DIAGNOSIS — Z803 Family history of malignant neoplasm of breast: Secondary | ICD-10-CM

## 2013-10-30 HISTORY — DX: Malignant neoplasm of unspecified site of unspecified female breast: C50.919

## 2013-10-30 HISTORY — PX: MASTECTOMY: SHX3

## 2013-10-30 HISTORY — PX: MASTECTOMY, MODIFIED RADICAL W/RECONSTRUCTION: SHX708

## 2013-10-30 HISTORY — PX: BREAST RECONSTRUCTION WITH PLACEMENT OF TISSUE EXPANDER AND FLEX HD (ACELLULAR HYDRATED DERMIS): SHX6295

## 2013-10-30 LAB — CREATININE, SERUM
CREATININE: 0.65 mg/dL (ref 0.50–1.10)
GFR calc Af Amer: >90 mL/min (ref >90)

## 2013-10-30 LAB — CBC
HEMATOCRIT: 40.1 % (ref 36.0–46.0)
Hemoglobin: 13.7 g/dL (ref 12.0–15.0)
MCH: 30.4 pg (ref 26.0–34.0)
MCHC: 34.2 g/dL (ref 30.0–36.0)
MCV: 88.9 fL (ref 78.0–100.0)
Platelets: 226 10*3/uL (ref 150–400)
RBC: 4.51 MIL/uL (ref 3.87–5.11)
RDW: 12.9 % (ref 11.5–15.5)
WBC: 11.3 10*3/uL — ABNORMAL HIGH (ref 4.0–10.5)

## 2013-10-30 SURGERY — MASTECTOMY MODIFIED RADICAL WITH RECONSTRUCTION
Anesthesia: General | Site: Breast | Laterality: Bilateral

## 2013-10-30 MED ORDER — CEFAZOLIN SODIUM-DEXTROSE 2-3 GM-% IV SOLR: 2.0000 g | INTRAVENOUS | Status: DC

## 2013-10-30 MED ORDER — EPHEDRINE SULFATE 50 MG/ML IJ SOLN
INTRAMUSCULAR | Status: AC
  Filled 2013-10-30: qty 1

## 2013-10-30 MED ORDER — DIPHENHYDRAMINE HCL 12.5 MG/5ML PO ELIX
12.5000 mg | ORAL_SOLUTION | Freq: Four times a day (QID) | ORAL | Status: DC | PRN
Start: 2013-10-30 — End: 2013-10-31

## 2013-10-30 MED ORDER — 0.9 % SODIUM CHLORIDE (POUR BTL) OPTIME
TOPICAL | Status: DC | PRN
  Administered 2013-10-30 (×3): 1000 mL

## 2013-10-30 MED ORDER — CEFAZOLIN SODIUM 1-5 GM-% IV SOLN
1.0000 g | Freq: Four times a day (QID) | INTRAVENOUS | Status: DC
  Administered 2013-10-30 – 2013-11-01 (×7): 1 g via INTRAVENOUS
  Filled 2013-10-30 (×8): qty 50

## 2013-10-30 MED ORDER — ARTIFICIAL TEARS OP OINT
TOPICAL_OINTMENT | OPHTHALMIC | Status: DC | PRN
  Administered 2013-10-30: 1 via OPHTHALMIC

## 2013-10-30 MED ORDER — CHLORHEXIDINE GLUCONATE 4 % EX LIQD: 1.0000 "application " | Freq: Once | CUTANEOUS | Status: DC

## 2013-10-30 MED ORDER — ATORVASTATIN CALCIUM 10 MG PO TABS
10.0000 mg | ORAL_TABLET | Freq: Every day | ORAL | Status: DC
  Administered 2013-10-31: 10 mg via ORAL
  Filled 2013-10-30 (×2): qty 1

## 2013-10-30 MED ORDER — OXYCODONE-ACETAMINOPHEN 5-325 MG PO TABS
1.0000 | ORAL_TABLET | ORAL | Status: DC | PRN
Start: 2013-10-30 — End: 2013-11-01
  Administered 2013-10-30: 1 via ORAL
  Administered 2013-10-31: 2 via ORAL
  Administered 2013-10-31: 1 via ORAL
  Administered 2013-11-01: 2 via ORAL
  Administered 2013-11-01 (×2): 1 via ORAL
  Filled 2013-10-30: qty 1
  Filled 2013-10-30 (×2): qty 2
  Filled 2013-10-30: qty 1
  Filled 2013-10-30: qty 2
  Filled 2013-10-30: qty 1

## 2013-10-30 MED ORDER — DEXAMETHASONE SODIUM PHOSPHATE 10 MG/ML IJ SOLN
INTRAMUSCULAR | Status: DC | PRN
  Administered 2013-10-30: 8 mg via INTRAVENOUS

## 2013-10-30 MED ORDER — AMLODIPINE BESYLATE 10 MG PO TABS
10.0000 mg | ORAL_TABLET | Freq: Every day | ORAL | Status: DC
  Administered 2013-10-31 – 2013-11-01 (×2): 10 mg via ORAL
  Filled 2013-10-30 (×2): qty 1

## 2013-10-30 MED ORDER — HYDROMORPHONE HCL PF 1 MG/ML IJ SOLN
0.2500 mg | INTRAMUSCULAR | Status: DC | PRN
  Administered 2013-10-30 (×2): 0.5 mg via INTRAVENOUS

## 2013-10-30 MED ORDER — MIDAZOLAM HCL 5 MG/5ML IJ SOLN
INTRAMUSCULAR | Status: DC | PRN
  Administered 2013-10-30: 2 mg via INTRAVENOUS

## 2013-10-30 MED ORDER — SODIUM CHLORIDE 0.9 % IV SOLN
INTRAVENOUS | Status: DC
  Filled 2013-10-30: qty 1

## 2013-10-30 MED ORDER — ROCURONIUM BROMIDE 100 MG/10ML IV SOLN
INTRAVENOUS | Status: DC | PRN
  Administered 2013-10-30: 10 mg via INTRAVENOUS
  Administered 2013-10-30: 20 mg via INTRAVENOUS
  Administered 2013-10-30: 5 mg via INTRAVENOUS
  Administered 2013-10-30: 20 mg via INTRAVENOUS
  Administered 2013-10-30: 30 mg via INTRAVENOUS

## 2013-10-30 MED ORDER — DEXTROSE-NACL 5-0.45 % IV SOLN: INTRAVENOUS | Status: DC

## 2013-10-30 MED ORDER — ALPRAZOLAM 0.25 MG PO TABS: 0.2500 mg | ORAL_TABLET | Freq: Three times a day (TID) | ORAL | Status: DC | PRN

## 2013-10-30 MED ORDER — DOCUSATE SODIUM 100 MG PO CAPS
100.0000 mg | ORAL_CAPSULE | Freq: Every day | ORAL | Status: DC
  Administered 2013-10-30 – 2013-11-01 (×3): 100 mg via ORAL
  Filled 2013-10-30 (×3): qty 1

## 2013-10-30 MED ORDER — MIDAZOLAM HCL 2 MG/2ML IJ SOLN
INTRAMUSCULAR | Status: AC
  Filled 2013-10-30: qty 2

## 2013-10-30 MED ORDER — ONDANSETRON HCL 4 MG/2ML IJ SOLN
INTRAMUSCULAR | Status: AC
  Filled 2013-10-30: qty 2

## 2013-10-30 MED ORDER — HEPARIN SODIUM (PORCINE) 5000 UNIT/ML IJ SOLN
5000.0000 [IU] | Freq: Three times a day (TID) | INTRAMUSCULAR | Status: DC
  Administered 2013-10-31 – 2013-11-01 (×4): 5000 [IU] via SUBCUTANEOUS
  Filled 2013-10-30 (×7): qty 1

## 2013-10-30 MED ORDER — ONDANSETRON HCL 4 MG/2ML IJ SOLN
4.0000 mg | Freq: Four times a day (QID) | INTRAMUSCULAR | Status: DC | PRN
  Administered 2013-10-30 – 2013-10-31 (×3): 4 mg via INTRAVENOUS
  Filled 2013-10-30 (×3): qty 2

## 2013-10-30 MED ORDER — DIPHENHYDRAMINE HCL 50 MG/ML IJ SOLN: 12.5000 mg | Freq: Four times a day (QID) | INTRAMUSCULAR | Status: DC | PRN

## 2013-10-30 MED ORDER — NEOSTIGMINE METHYLSULFATE 1 MG/ML IJ SOLN
INTRAMUSCULAR | Status: DC | PRN
  Administered 2013-10-30: 5 mg via INTRAVENOUS

## 2013-10-30 MED ORDER — METHOCARBAMOL 500 MG PO TABS
500.0000 mg | ORAL_TABLET | Freq: Four times a day (QID) | ORAL | Status: DC
  Administered 2013-10-30 – 2013-11-01 (×7): 500 mg via ORAL
  Filled 2013-10-30 (×12): qty 1

## 2013-10-30 MED ORDER — LIDOCAINE HCL (CARDIAC) 20 MG/ML IV SOLN
INTRAVENOUS | Status: DC | PRN
  Administered 2013-10-30: 60 mg via INTRAVENOUS

## 2013-10-30 MED ORDER — PROPOFOL 10 MG/ML IV BOLUS
INTRAVENOUS | Status: AC
  Filled 2013-10-30: qty 20

## 2013-10-30 MED ORDER — FENTANYL CITRATE 0.05 MG/ML IJ SOLN
INTRAMUSCULAR | Status: DC | PRN
  Administered 2013-10-30 (×2): 50 ug via INTRAVENOUS
  Administered 2013-10-30: 100 ug via INTRAVENOUS
  Administered 2013-10-30: 50 ug via INTRAVENOUS

## 2013-10-30 MED ORDER — DEXAMETHASONE SODIUM PHOSPHATE 4 MG/ML IJ SOLN
INTRAMUSCULAR | Status: AC
  Filled 2013-10-30: qty 2

## 2013-10-30 MED ORDER — ONDANSETRON HCL 4 MG/2ML IJ SOLN: 4.0000 mg | Freq: Four times a day (QID) | INTRAMUSCULAR | Status: DC | PRN

## 2013-10-30 MED ORDER — NALOXONE HCL 0.4 MG/ML IJ SOLN: 0.4000 mg | INTRAMUSCULAR | Status: DC | PRN

## 2013-10-30 MED ORDER — METHOCARBAMOL 500 MG PO TABS
500.0000 mg | ORAL_TABLET | Freq: Three times a day (TID) | ORAL | Status: DC
Start: 2013-10-30 — End: 2013-10-30

## 2013-10-30 MED ORDER — HYDROMORPHONE 0.3 MG/ML IV SOLN
INTRAVENOUS | Status: AC
  Filled 2013-10-30: qty 25

## 2013-10-30 MED ORDER — ROCURONIUM BROMIDE 50 MG/5ML IV SOLN
INTRAVENOUS | Status: AC
  Filled 2013-10-30: qty 1

## 2013-10-30 MED ORDER — ONDANSETRON HCL 4 MG/2ML IJ SOLN
INTRAMUSCULAR | Status: DC | PRN
  Administered 2013-10-30 (×3): 4 mg via INTRAVENOUS

## 2013-10-30 MED ORDER — PROPOFOL 10 MG/ML IV BOLUS
INTRAVENOUS | Status: DC | PRN
  Administered 2013-10-30: 200 mg via INTRAVENOUS

## 2013-10-30 MED ORDER — ONDANSETRON HCL 4 MG/2ML IJ SOLN
4.0000 mg | Freq: Once | INTRAMUSCULAR | Status: DC | PRN
Start: 2013-10-30 — End: 2013-10-30

## 2013-10-30 MED ORDER — PHENYLEPHRINE HCL 10 MG/ML IJ SOLN
INTRAMUSCULAR | Status: DC | PRN
  Administered 2013-10-30 (×7): 80 ug via INTRAVENOUS
  Administered 2013-10-30: 40 ug via INTRAVENOUS
  Administered 2013-10-30 (×4): 80 ug via INTRAVENOUS
  Administered 2013-10-30: 40 ug via INTRAVENOUS
  Administered 2013-10-30 (×3): 80 ug via INTRAVENOUS

## 2013-10-30 MED ORDER — PHENYLEPHRINE HCL 10 MG/ML IJ SOLN
INTRAMUSCULAR | Status: AC
  Filled 2013-10-30: qty 1

## 2013-10-30 MED ORDER — PHENYLEPHRINE 40 MCG/ML (10ML) SYRINGE FOR IV PUSH (FOR BLOOD PRESSURE SUPPORT)
PREFILLED_SYRINGE | INTRAVENOUS | Status: AC
  Filled 2013-10-30: qty 10

## 2013-10-30 MED ORDER — PHENYLEPHRINE HCL 10 MG/ML IJ SOLN
10.0000 mg | INTRAVENOUS | Status: DC | PRN
  Administered 2013-10-30: 20 ug/min via INTRAVENOUS

## 2013-10-30 MED ORDER — SODIUM CHLORIDE 0.9 % IJ SOLN: 9.0000 mL | INTRAMUSCULAR | Status: DC | PRN

## 2013-10-30 MED ORDER — GLYCOPYRROLATE 0.2 MG/ML IJ SOLN
INTRAMUSCULAR | Status: DC | PRN
  Administered 2013-10-30: .8 mg via INTRAVENOUS

## 2013-10-30 MED ORDER — HYDROMORPHONE 0.3 MG/ML IV SOLN
INTRAVENOUS | Status: DC
  Administered 2013-10-30: 1.39 mg via INTRAVENOUS
  Administered 2013-10-30: 1.59 mg via INTRAVENOUS
  Administered 2013-10-31: 1.17 mg via INTRAVENOUS
  Administered 2013-10-31: 2.39 mg via INTRAVENOUS
  Administered 2013-10-31: 1.17 mg via INTRAVENOUS
  Filled 2013-10-30: qty 25

## 2013-10-30 MED ORDER — SODIUM CHLORIDE 0.9 % IR SOLN
Status: DC | PRN
  Administered 2013-10-30: 10:00:00

## 2013-10-30 MED ORDER — HEPARIN SODIUM (PORCINE) 5000 UNIT/ML IJ SOLN
5000.0000 [IU] | Freq: Once | INTRAMUSCULAR | Status: AC
  Administered 2013-10-30: 5000 [IU] via SUBCUTANEOUS
  Filled 2013-10-30: qty 1

## 2013-10-30 MED ORDER — FENTANYL CITRATE 0.05 MG/ML IJ SOLN
INTRAMUSCULAR | Status: AC
  Filled 2013-10-30: qty 5

## 2013-10-30 MED ORDER — PROMETHAZINE HCL 25 MG/ML IJ SOLN
6.2500 mg | INTRAMUSCULAR | Status: DC | PRN
  Administered 2013-10-30: 6.25 mg via INTRAVENOUS
  Filled 2013-10-30: qty 1

## 2013-10-30 MED ORDER — ONDANSETRON HCL 4 MG PO TABS: 4.0000 mg | ORAL_TABLET | Freq: Four times a day (QID) | ORAL | Status: DC | PRN

## 2013-10-30 MED ORDER — MORPHINE SULFATE 4 MG/ML IJ SOLN
4.0000 mg | INTRAMUSCULAR | Status: DC | PRN
  Administered 2013-10-31: 4 mg via INTRAVENOUS
  Filled 2013-10-30: qty 1

## 2013-10-30 MED ORDER — HEPARIN SODIUM (PORCINE) 5000 UNIT/ML IJ SOLN: 5000.0000 [IU] | Freq: Three times a day (TID) | INTRAMUSCULAR | Status: DC

## 2013-10-30 MED ORDER — SODIUM CHLORIDE 0.9 % IJ SOLN
INTRAMUSCULAR | Status: AC
  Filled 2013-10-30: qty 10

## 2013-10-30 MED ORDER — HYDROMORPHONE 0.3 MG/ML IV SOLN
INTRAVENOUS | Status: DC
  Administered 2013-10-30: 12:00:00 via INTRAVENOUS

## 2013-10-30 MED ORDER — HYDROMORPHONE HCL PF 1 MG/ML IJ SOLN
INTRAMUSCULAR | Status: AC
  Administered 2013-10-30: 0.5 mg via INTRAVENOUS
  Filled 2013-10-30: qty 1

## 2013-10-30 MED ORDER — LIDOCAINE HCL 4 % MT SOLN
OROMUCOSAL | Status: DC | PRN
  Administered 2013-10-30: 4 mL via TOPICAL

## 2013-10-30 MED ORDER — GLYCOPYRROLATE 0.2 MG/ML IJ SOLN
INTRAMUSCULAR | Status: AC
  Filled 2013-10-30: qty 4

## 2013-10-30 MED ORDER — KCL IN DEXTROSE-NACL 20-5-0.9 MEQ/L-%-% IV SOLN
INTRAVENOUS | Status: DC
  Administered 2013-10-30 – 2013-10-31 (×2): via INTRAVENOUS
  Filled 2013-10-30 (×3): qty 1000

## 2013-10-30 MED ORDER — HYDROCHLOROTHIAZIDE 25 MG PO TABS
25.0000 mg | ORAL_TABLET | Freq: Every day | ORAL | Status: DC
  Administered 2013-10-31 – 2013-11-01 (×2): 25 mg via ORAL
  Filled 2013-10-30 (×3): qty 1

## 2013-10-30 MED ORDER — LACTATED RINGERS IV SOLN
INTRAVENOUS | Status: DC | PRN
  Administered 2013-10-30 (×3): via INTRAVENOUS

## 2013-10-30 MED ORDER — CEFAZOLIN SODIUM-DEXTROSE 2-3 GM-% IV SOLR
INTRAVENOUS | Status: AC
Start: 2013-10-30 — End: 2013-10-30
  Filled 2013-10-30: qty 50

## 2013-10-30 MED ORDER — EPHEDRINE SULFATE 50 MG/ML IJ SOLN
INTRAMUSCULAR | Status: DC | PRN
  Administered 2013-10-30: 10 mg via INTRAVENOUS
  Administered 2013-10-30: 5 mg via INTRAVENOUS

## 2013-10-30 MED ORDER — LIDOCAINE HCL (CARDIAC) 20 MG/ML IV SOLN
INTRAVENOUS | Status: AC
  Filled 2013-10-30: qty 5

## 2013-10-30 MED ORDER — DIPHENHYDRAMINE HCL 12.5 MG/5ML PO ELIX: 12.5000 mg | ORAL_SOLUTION | Freq: Four times a day (QID) | ORAL | Status: DC | PRN

## 2013-10-30 SURGICAL SUPPLY — 66 items
APPLIER CLIP 9.375 MED OPEN (MISCELLANEOUS) ×12
ATCH SMKEVC FLXB CAUT HNDSWH (FILTER) ×2 IMPLANT
BAG DECANTER FOR FLEXI CONT (MISCELLANEOUS) ×4 IMPLANT
BINDER BREAST XLRG (GAUZE/BANDAGES/DRESSINGS) ×4 IMPLANT
BIOPATCH RED 1 DISK 7.0 (GAUZE/BANDAGES/DRESSINGS) ×12 IMPLANT
BIOPATCH RED 1IN DISK 7.0MM (GAUZE/BANDAGES/DRESSINGS) ×4
CANISTER SUCTION 2500CC (MISCELLANEOUS) ×8 IMPLANT
CHLORAPREP W/TINT 26ML (MISCELLANEOUS) ×8 IMPLANT
CLIP APPLIE 9.375 MED OPEN (MISCELLANEOUS) ×6 IMPLANT
CLIP TI MEDIUM 6 (CLIP) ×4 IMPLANT
CLIP TI WIDE RED SMALL 6 (CLIP) ×4 IMPLANT
CLOTH BEACON ORANGE TIMEOUT ST (SAFETY) ×4 IMPLANT
COVER SURGICAL LIGHT HANDLE (MISCELLANEOUS) ×8 IMPLANT
DERMABOND ADHESIVE PROPEN (GAUZE/BANDAGES/DRESSINGS) ×2
DERMABOND ADVANCED (GAUZE/BANDAGES/DRESSINGS) ×2
DERMABOND ADVANCED .7 DNX12 (GAUZE/BANDAGES/DRESSINGS) ×2 IMPLANT
DERMABOND ADVANCED .7 DNX6 (GAUZE/BANDAGES/DRESSINGS) ×2 IMPLANT
DRAIN CHANNEL 19F RND (DRAIN) ×16 IMPLANT
DRAPE ORTHO SPLIT 77X108 STRL (DRAPES) ×4
DRAPE PROXIMA HALF (DRAPES) ×12 IMPLANT
DRAPE SURG 17X23 STRL (DRAPES) ×8 IMPLANT
DRAPE SURG ORHT 6 SPLT 77X108 (DRAPES) ×4 IMPLANT
DRAPE WARM FLUID 44X44 (DRAPE) ×4 IMPLANT
DRSG PAD ABDOMINAL 8X10 ST (GAUZE/BANDAGES/DRESSINGS) ×8 IMPLANT
DRSG TEGADERM 4X4.75 (GAUZE/BANDAGES/DRESSINGS) ×8 IMPLANT
ELECT BLADE 4.0 EZ CLEAN MEGAD (MISCELLANEOUS) ×4
ELECT BLADE 6.5 EXT (BLADE) IMPLANT
ELECT CAUTERY BLADE 6.4 (BLADE) ×8 IMPLANT
ELECT REM PT RETURN 9FT ADLT (ELECTROSURGICAL) ×8
ELECTRODE BLDE 4.0 EZ CLN MEGD (MISCELLANEOUS) ×2 IMPLANT
ELECTRODE REM PT RTRN 9FT ADLT (ELECTROSURGICAL) ×4 IMPLANT
EVACUATOR SILICONE 100CC (DRAIN) ×16 IMPLANT
EVACUATOR SMOKE ACCUVAC VALLEY (FILTER) ×2
GLOVE BIO SURGEON STRL SZ7.5 (GLOVE) ×16 IMPLANT
GLOVE BIOGEL PI IND STRL 8 (GLOVE) ×2 IMPLANT
GLOVE BIOGEL PI INDICATOR 8 (GLOVE) ×2
GOWN PREVENTION PLUS XLARGE (GOWN DISPOSABLE) ×4 IMPLANT
GOWN STRL NON-REIN LRG LVL3 (GOWN DISPOSABLE) ×12 IMPLANT
GOWN STRL REIN XL XLG (GOWN DISPOSABLE) ×4 IMPLANT
IMPL BREAST TIS EXP M 650CC (Breast) ×4 IMPLANT
IMPLANT BREAST TIS EXP M 650CC (Breast) ×8 IMPLANT
KIT BASIN OR (CUSTOM PROCEDURE TRAY) ×8 IMPLANT
KIT ROOM TURNOVER OR (KITS) ×8 IMPLANT
MARKER SKIN DUAL TIP RULER LAB (MISCELLANEOUS) ×4 IMPLANT
NEEDLE 21 GA WING INFUSION (NEEDLE) ×4 IMPLANT
NS IRRIG 1000ML POUR BTL (IV SOLUTION) ×20 IMPLANT
PACK GENERAL/GYN (CUSTOM PROCEDURE TRAY) ×8 IMPLANT
PAD ARMBOARD 7.5X6 YLW CONV (MISCELLANEOUS) ×8 IMPLANT
PREFILTER EVAC NS 1 1/3-3/8IN (MISCELLANEOUS) ×4 IMPLANT
SET ASEPTIC TRANSFER (MISCELLANEOUS) ×8 IMPLANT
SPECIMEN JAR X LARGE (MISCELLANEOUS) ×8 IMPLANT
STAPLER VISISTAT 35W (STAPLE) ×8 IMPLANT
SUT MNCRL AB 3-0 PS2 18 (SUTURE) ×16 IMPLANT
SUT PDS AB 3-0 SH 27 (SUTURE) IMPLANT
SUT PROLENE 3 0 PS 2 (SUTURE) ×16 IMPLANT
SUT SILK 2 0 FS (SUTURE) ×8 IMPLANT
SUT VIC AB 3-0 54X BRD REEL (SUTURE) IMPLANT
SUT VIC AB 3-0 BRD 54 (SUTURE)
SUT VIC AB 3-0 SH 18 (SUTURE) ×16 IMPLANT
SYR BULB IRRIGATION 50ML (SYRINGE) ×8 IMPLANT
TOWEL OR 17X24 6PK STRL BLUE (TOWEL DISPOSABLE) ×8 IMPLANT
TOWEL OR 17X26 10 PK STRL BLUE (TOWEL DISPOSABLE) ×8 IMPLANT
TOWEL OR NON WOVEN STRL DISP B (DISPOSABLE) ×8 IMPLANT
TRAY FOLEY CATH 14FRSI W/METER (CATHETERS) ×4 IMPLANT
TUBE CONNECTING 12'X1/4 (SUCTIONS) ×1
TUBE CONNECTING 12X1/4 (SUCTIONS) ×3 IMPLANT

## 2013-10-30 NOTE — Preoperative (Signed)
Beta Blockers   Reason not to administer Beta Blockers:Not Applicable 

## 2013-10-30 NOTE — Transfer of Care (Signed)
Immediate Anesthesia Transfer of Care Note  Patient: Kathy Watkins  Procedure(s) Performed: Procedure(s) with comments: BILATERAL MASTECTOMY  (Bilateral) - Bilateral  BILATERAL BREAST RECONSTRUCTION WITH PLACEMENT OF TISSUE EXPANDER AND POSSIBLE USE OF FLEX HD (ACELLULAR HYDRATED DERMIS) (Bilateral)  Patient Location: PACU  Anesthesia Type:General  Level of Consciousness: awake, alert , oriented and patient cooperative  Airway & Oxygen Therapy: Patient Spontanous Breathing and Patient connected to nasal cannula oxygen  Post-op Assessment: Report given to PACU RN and Post -op Vital signs reviewed and stable  Post vital signs: Reviewed and stable  Complications: No apparent anesthesia complications

## 2013-10-30 NOTE — H&P (Signed)
Kathy Watkins  09/22/2013 10:10 AM   Office Visit  MRN:  [id number]   Description: 53 year old female  Provider: Merrie Roof, MD  Department: [facility]          Diagnoses      Breast neoplasm, Tis (LCIS), right    -  Primary      233.0             Reason for Visit      Routine Post Op      reck rt br               Current Vitals - Last Recorded      BP Pulse Temp(Src) Resp Ht Wt      138/90 80 98.6 F (37 C) (Temporal) 14 5' 5"  (1.651 m) 170 lb (77.111 kg)            BMI                28.29 kg/m2                      Vitals History Recorded            Progress Notes      Merrie Roof, MD at 09/22/2013 10:37 AM      Status: Signed            Patient ID: Kathy Watkins, female   DOB: [birth year], 53 y.o.   MRN: [id number]    Chief Complaint   Patient presents with   .  Routine Post Op       reck rt br        HPI Kathy Watkins is a 53 y.o. female.  The patient is a 53 year old white female who was seen recently with a right breast biopsy that showed LCIS. She has a strong family history of breast cancer. She has met with Dr. Humphrey Rolls in the high-risk clinic And also with the genetics counselor. She is very concerned about her risk of getting breast cancer. Because of this she is considering bilateral mastectomies with reconstructions. HPI    Past Medical History   Diagnosis  Date   .  Hypertension     .  Hyperlipidemia     .  Colon polyps           Past Surgical History   Procedure  Laterality  Date   .  Abdominal hysterectomy    1994       adenomyosis   .  Breast surgery    2008       lumpectomy   .  Parotid gland tumor excision    2000         Family History   Problem  Relation  Age of Onset   .  Hypertension  Mother     .  Breast cancer  Mother  7   .  Kidney disease  Father     .  Hypertension  Father     .  Hyperlipidemia  Father     .  Prostate cancer  Father  15   .  Hypertension  Sister     .   Hyperlipidemia  Sister     .  Colon polyps  Sister     .  Heart attack  Maternal Uncle     .  Melanoma  Paternal Aunt  45  on leg   .  Lung cancer  Maternal Grandmother         smoker   .  Colon cancer  Paternal Grandmother  53   .  Stroke  Paternal Grandfather  10        Social History History   Substance Use Topics   .  Smoking status:  Never Smoker    .  Smokeless tobacco:  Never Used   .  Alcohol Use:  1.2 oz/week       2 Glasses of wine per week        No Known Allergies    Current Outpatient Prescriptions   Medication  Sig  Dispense  Refill   .  ALPRAZolam (XANAX) 0.25 MG tablet  TAKE 1 TABLET BY MOUTH 3 TIMES A DAY AS NEEDED   60 tablet   0   .  amLODipine (NORVASC) 10 MG tablet  Take 1 tablet (10 mg total) by mouth daily.   90 tablet   1   .  hydrochlorothiazide (HYDRODIURIL) 25 MG tablet  Take 1 tablet (25 mg total) by mouth daily.   90 tablet   1   .  rosuvastatin (CRESTOR) 5 MG tablet  1 daily             No current facility-administered medications for this visit.        Review of Systems Review of Systems  Constitutional: Negative.   HENT: Negative.   Eyes: Negative.   Respiratory: Negative.   Cardiovascular: Negative.   Gastrointestinal: Negative.   Endocrine: Negative.   Genitourinary: Negative.   Musculoskeletal: Negative.   Skin: Negative.   Allergic/Immunologic: Negative.   Neurological: Negative.   Hematological: Negative.   Psychiatric/Behavioral: Negative.       Blood pressure 138/90, pulse 80, temperature 98.6 F (37 C), temperature source Temporal, resp. rate 14, height 5' 5"  (1.651 m), weight 170 lb (77.111 kg).   Physical Exam Physical Exam  Constitutional: She is oriented to person, place, and time. She appears well-developed and well-nourished.  HENT:   Head: Normocephalic and atraumatic.  Eyes: Conjunctivae and EOM are normal. Pupils are equal, round, and reactive to light.  Neck: Normal range of motion. Neck  supple.  Cardiovascular: Normal rate, regular rhythm and normal heart sounds.   Pulmonary/Chest: Effort normal and breath sounds normal.  There is no palpable mass in either breast. There is no palpable axillary, supraclavicular, or cervical lymphadenopathy  Abdominal: Soft. Bowel sounds are normal.  Musculoskeletal: Normal range of motion.  Lymphadenopathy:    She has no cervical adenopathy.  Neurological: She is alert and oriented to person, place, and time.  Skin: Skin is warm and dry.  Psychiatric: She has a normal mood and affect. Her behavior is normal.      Data Reviewed As above   Assessment    The patient has a diagnosis of LCIS of the right breast and appears to have a pretty significantly increased risk of breast cancer in general. Because of this she is considering bilateral mastectomies with reconstructions. I think this is a very reasonable way to try to reduce her risk. I've discussed with her in detail the risks and benefits of the operation to do this as well as some of the technical aspects and she understands.      Plan    At this point we will refer her to Dr. Harlow Mares and plastic surgery and then begin planning for bilateral mastectomies with reconstruction

## 2013-10-30 NOTE — Brief Op Note (Signed)
10/30/2013  11:43 AM  PATIENT:  [name]  53 y.o. female  PRE-OPERATIVE DIAGNOSIS:  RIGHT BREAST LCIS  POST-OPERATIVE DIAGNOSIS:  RIGHT BREAST LCIS  PROCEDURE:  Procedure(s) with comments: BILATERAL MASTECTOMY  (Bilateral) - Bilateral  BILATERAL BREAST RECONSTRUCTION WITH PLACEMENT OF TISSUE EXPANDER AND POSSIBLE USE OF FLEX HD (ACELLULAR HYDRATED DERMIS) (Bilateral)  SURGEON:  Surgeon(s) and Role: Panel 1:    * Merrie Roof, MD - Primary  Panel 2:    * Crissie Reese, MD - Primary  PHYSICIAN ASSISTANT:   ASSISTANTS: Sharon   ANESTHESIA:   general  EBL:  Total I/O In: 2000 [I.V.:2000] Out: 210 [Urine:150; Blood:60]  BLOOD ADMINISTERED:none  DRAINS: (4) Jackson-Pratt drain(s) with closed bulb suction in the left chest (2) and right chest (2)   LOCAL MEDICATIONS USED:  NONE  SPECIMEN:  No Specimen  DISPOSITION OF SPECIMEN:  N/A  COUNTS:  YES  TOURNIQUET:  * No tourniquets in log *  DICTATION: .Other Dictation: Dictation Number [id number]  PLAN OF CARE: Admit to inpatient   PATIENT DISPOSITION:  PACU - hemodynamically stable.   Delay start of Pharmacological VTE agent (>24hrs) due to surgical blood loss or risk of bleeding: no

## 2013-10-30 NOTE — Interval H&P Note (Signed)
History and Physical Interval Note:  10/30/2013 7:17 AM  Kathy Watkins  has presented today for surgery, with the diagnosis of right breast  LCIS  The various methods of treatment have been discussed with the patient and family. After consideration of risks, benefits and other options for treatment, the patient has consented to  Procedure(s): BILATERAL MASTECTOMY WITH RECONSTRUCTION  (Bilateral) BILATERAL BREAST RECONSTRUCTION WITH PLACEMENT OF TISSUE EXPANDER AND POSSIBLE USE OF FLEX HD (ACELLULAR HYDRATED DERMIS) (Bilateral) as a surgical intervention .  The patient's history has been reviewed, patient examined, no change in status, stable for surgery.  I have reviewed the patient's chart and labs.  Questions were answered to the patient's satisfaction.     TOTH III,Alvenia Treese S

## 2013-10-30 NOTE — Anesthesia Preprocedure Evaluation (Addendum)
Anesthesia Evaluation  Patient identified by MRN, date of birth, ID band Patient awake    Reviewed: Allergy & Precautions, H&P , NPO status , Patient's Chart, lab work & pertinent test results  History of Anesthesia Complications (+) PONV and history of anesthetic complications  Airway Mallampati: II TM Distance: >3 FB Neck ROM: Full    Dental  (+) Teeth Intact, Dental Advisory Given and Caps,    Pulmonary          Cardiovascular hypertension, Pt. on medications     Neuro/Psych  Headaches, PSYCHIATRIC DISORDERS Anxiety    GI/Hepatic negative GI ROS, Neg liver ROS,   Endo/Other  negative endocrine ROS  Renal/GU negative Renal ROS     Musculoskeletal negative musculoskeletal ROS (+)   Abdominal   Peds  Hematology negative hematology ROS (+)   Anesthesia Other Findings   Reproductive/Obstetrics negative OB ROS                        Anesthesia Physical Anesthesia Plan  ASA: II  Anesthesia Plan: General   Post-op Pain Management:    Induction: Intravenous  Airway Management Planned: Oral ETT  Additional Equipment:   Intra-op Plan:   Post-operative Plan: Extubation in OR  Informed Consent: I have reviewed the patients History and Physical, chart, labs and discussed the procedure including the risks, benefits and alternatives for the proposed anesthesia with the patient or authorized representative who has indicated his/her understanding and acceptance.     Plan Discussed with:   Anesthesia Plan Comments:         Anesthesia Quick Evaluation

## 2013-10-30 NOTE — Op Note (Signed)
10/30/2013  9:37 AM  PATIENT:  Kathy Watkins  53 y.o. female  PRE-OPERATIVE DIAGNOSIS:  RIGHT BREAST LCIS  POST-OPERATIVE DIAGNOSIS:  RIGHT BREAST LCIS  PROCEDURE:  Procedure(s) with comments: BILATERAL MASTECTOMY  (Bilateral) - Bilateral  BILATERAL BREAST RECONSTRUCTION WITH PLACEMENT OF TISSUE EXPANDER AND POSSIBLE USE OF FLEX HD (ACELLULAR HYDRATED DERMIS) (Bilateral)  SURGEON:  Surgeon(s) and Role: Panel 1:    * Merrie Roof, MD - Primary  Panel 2:    * Crissie Reese, MD - Primary  PHYSICIAN ASSISTANT:   ASSISTANTS: none   ANESTHESIA:   general  EBL:  Total I/O In: 1000 [I.V.:1000] Out: 130 [Urine:110; Blood:20]  BLOOD ADMINISTERED:none  DRAINS: none   LOCAL MEDICATIONS USED:  NONE  SPECIMEN:  Source of Specimen:  bilateral breast tissue marked short stitch superior and long stitch lateral  DISPOSITION OF SPECIMEN:  PATHOLOGY  COUNTS:  YES  TOURNIQUET:  * No tourniquets in log *  DICTATION: .Dragon Dictation After informed consent was obtained patient was brought to the operating room and placed in the supine position on the operating room table. After adequate induction of general anesthesia the patient's bilateral chest, breast, and axillary areas were prepped with ChloraPrep, allowed to dry, and draped in usual sterile manner. Elliptical incisions were mapped out around the nipple and areola complex In order to minimize the skin removed. Attention was first turned to the right breast. An incision was made with a 10 blade knife along the lines. This incision was carried through the skin and subcutaneous tissue sharply with electrocautery. Breast hooks were then used to elevate the skin flaps anteriorly towards the ceiling. Thin skin flaps were then created circumferentially between the breast tissue and subcutaneous fat. This dissection was carried all the way to the chest wall. The breast was then removed from the pectoralis muscle with the pectoralis fascia.  This was also done sharply with the electrocautery. Once the breast was removed it was oriented with a short stitch on the superior surface of the long stitch on the lateral surface and sent to pathology for further evaluation. Several small perforating vessels were controlled with clips. The wound was then irrigated with copious amounts of saline. Hemostasis was achieved using the Bovie electrocautery. The wound is then packed with a moistened lap sponge and covered with a sterile blue towel. Attention was then turned to the left breast. A similar elliptical incision was made with a 10 blade knife. This incision was carried through the skin and subcutaneous tissue sharply with the electrocautery. Breast which were then used to elevate the skin flaps anteriorly towards the ceiling. Then skin flaps were created circumferentially between the breast tissue and the subcutaneous fat. This dissection was carried all the way to the chest wall. The breast was then removed from the pectoralis muscle with the pectoralis fascia. This was also done sharply with the electrocautery. Several small perforating vessels were controlled with clips. Once the breast was removed it was oriented with a short stitch on the superior surface of the long stitch on the lateral surface. The breast was then sent to pathology for further evaluation. Hemostasis was achieved using the Bovie electrocautery. The wound was irrigated with copious amounts of saline and packed with a moistened lap sponge. The patient tolerated the procedure well. At this point all needle sponge and instrument counts were correct. The patient was in stable condition. At this point the operation was turned over to Dr. Harlow Mares for reconstruction. His portion  will be dictated separately.  PLAN OF CARE: Admit for overnight observation  PATIENT DISPOSITION:  PACU - hemodynamically stable.   Delay start of Pharmacological VTE agent (>24hrs) due to surgical blood loss or  risk of bleeding: no

## 2013-10-30 NOTE — Anesthesia Postprocedure Evaluation (Signed)
  Anesthesia Post-op Note  Patient: Kathy Watkins  Procedure(s) Performed: Procedure(s) with comments: BILATERAL MASTECTOMY  (Bilateral) - Bilateral  BILATERAL BREAST RECONSTRUCTION WITH PLACEMENT OF TISSUE EXPANDER AND POSSIBLE USE OF FLEX HD (ACELLULAR HYDRATED DERMIS) (Bilateral)  Patient Location: PACU  Anesthesia Type:General  Level of Consciousness: awake, oriented, sedated and patient cooperative  Airway and Oxygen Therapy: Patient Spontanous Breathing  Post-op Pain: mild  Post-op Assessment: Post-op Vital signs reviewed, Patient's Cardiovascular Status Stable, Respiratory Function Stable, Patent Airway, No signs of Nausea or vomiting and Pain level controlled  Post-op Vital Signs: stable  Complications: No apparent anesthesia complications

## 2013-10-31 ENCOUNTER — Encounter (HOSPITAL_COMMUNITY): Payer: Self-pay | Admitting: General Surgery

## 2013-10-31 LAB — BASIC METABOLIC PANEL
BUN: 11 mg/dL (ref 6–23)
CO2: 22 mEq/L (ref 19–32)
Calcium: 8.1 mg/dL — ABNORMAL LOW (ref 8.4–10.5)
Chloride: 102 mEq/L (ref 96–112)
Creatinine, Ser: 0.56 mg/dL (ref 0.50–1.10)
Glucose, Bld: 170 mg/dL — ABNORMAL HIGH (ref 70–99)
POTASSIUM: 3.7 meq/L (ref 3.7–5.3)
SODIUM: 138 meq/L (ref 137–147)

## 2013-10-31 LAB — CBC
HCT: 37.8 % (ref 36.0–46.0)
Hemoglobin: 12.6 g/dL (ref 12.0–15.0)
MCH: 30 pg (ref 26.0–34.0)
MCHC: 33.3 g/dL (ref 30.0–36.0)
MCV: 90 fL (ref 78.0–100.0)
PLATELETS: 226 10*3/uL (ref 150–400)
RBC: 4.2 MIL/uL (ref 3.87–5.11)
RDW: 13.2 % (ref 11.5–15.5)
WBC: 11.7 10*3/uL — ABNORMAL HIGH (ref 4.0–10.5)

## 2013-10-31 MED ORDER — METOCLOPRAMIDE HCL 10 MG PO TABS
10.0000 mg | ORAL_TABLET | Freq: Three times a day (TID) | ORAL | Status: DC
  Administered 2013-10-31 – 2013-11-01 (×3): 10 mg via ORAL
  Filled 2013-10-31 (×6): qty 1

## 2013-10-31 MED ORDER — MENTHOL 3 MG MT LOZG: 1.0000 | LOZENGE | OROMUCOSAL | Status: DC | PRN

## 2013-10-31 NOTE — Progress Notes (Signed)
Foley catheter discontinued with 400 cc urine output.

## 2013-10-31 NOTE — Progress Notes (Signed)
Subjective: Sore but good pain control. Some nausea.  Objective: Vital signs in last 24 hours: Temp:  [97 F (36.1 C)-98.4 F (36.9 C)] 98.2 F (36.8 C) (02/03 0637) Pulse Rate:  [80-113] 80 (02/03 0637) Resp:  [8-20] 20 (02/03 0637) BP: (98-137)/(25-69) 137/62 mmHg (02/03 0637) SpO2:  [91 %-99 %] 98 % (02/03 0637) FiO2 (%):  [39 %-40 %] 40 % (02/03 0517) Weight:  [170 lb 8 oz (77.338 kg)] 170 lb 8 oz (77.338 kg) (02/02 2154)  Intake/Output from previous day: 02/02 0701 - 02/03 0700 In: 4170 [I.V.:4100] Out: 1400 [Urine:1125; Drains:195; Blood:80] Intake/Output this shift:    Operative sites: Mastectomy flaps look good. No evidence of vascular compromise at any site. Tissue expanders appear to be in good position. Drains functioning. Drainage thin.   Recent Labs  10/30/13 1549 10/31/13 0710  WBC 11.3* 11.7*  HGB 13.7 12.6  HCT 40.1 37.8  NA  --  138  K  --  3.7  CL  --  102  CO2  --  22  BUN  --  11  CREATININE 0.65 0.56    Studies/Results: No results found.  Assessment/Plan: Ambulate. PO pain med. D/C PCA. Reglan to help with nausea  LOS: 1 day    Kathy Watkins M 10/31/2013 9:01 AM

## 2013-10-31 NOTE — Op Note (Signed)
Kathy Watkins, SCRIVENS NO.:  [id number]  MEDICAL RECORD NO.:  [id number]  LOCATION:  6N21C                        FACILITY:  [facility]  PHYSICIAN:  Crissie Reese, M.D.     DATE OF BIRTH:  1960-10-30  DATE OF PROCEDURE:  10/30/2013 DATE OF DISCHARGE:                              OPERATIVE REPORT   PREOPERATIVE DIAGNOSIS:  Breast cancer.  POSTOPERATIVE DIAGNOSIS:  Breast cancer.  PROCEDURE:  Bilateral breast reconstruction with tissue expander.  SURGEON:  Crissie Reese, MD  ANESTHESIA:  General.  ESTIMATED BLOOD LOSS:  20 mL.  DRAINS:  Two 19-French on each side.  CLINICAL NOTE:  A 53 year old woman who is here for bilateral mastectomy for breast cancer.  She desires reconstruction.  Options were discussed and she selected placement of tissue expanders immediate as a stage procedure for eventual placement of implant.  Procedure, its risks plus complications were discussed her in detail.  These risks include, but not limited to, bleeding, infection, healing problems, scarring, loss of sensation, fluid accumulations, anesthesia related complications, healing problems, pneumothorax, DVT, pulmonary embolism, failure of device, capsular contracture, displaced device, wrinkles, ripples, contour deformities, contour deformities of the periphery of the expander, chronic pain, asymmetry and disappointment.  She understood all this and wished to proceed.  DESCRIPTION OF PROCEDURE:  The patient was in the operating room and bilateral mastectomy completed.  The skin flaps were inspected and were noted to have excellent color and bright red bleeding at the periphery consistent viability.  Irrigation was performed with saline as well as antibiotic solution.  The dissection was then carried out deep to the pectoralis major muscle and raised in a portion of the serratus anterior laterally.  Dissection continued down to the inframammary crease that had been marked  preoperatively.  A space was developed to the dimensions of the tissue expander.  Great care was taken throughout this dissection to avoid damage to the underlying chest cavity.  Hemostasis with electrocautery.  Irrigation with antibiotic solution.  Excellent hemostasis confirmed.  After thoroughly cleaning gloves, the expanders were prepared.  These were Mentor 650 mL medium height tissue expanders. A 150 mL of sterile saline placed using a closed filling system.  All the air removed and the expanders were returned to the antibiotic solution to soak.  Once the expanders were positioned, care was taken to ensure it was properly oriented and flat without wrinkles and folds. Once in position, they were irrigated again with antibiotic solution, and the muscle closed with 3-0 Vicryl interrupted figure-of-eight sutures.  Two 19-French drains were positioned, brought through separate stab wounds inferolaterally and secured with 3-0 Prolene sutures.  The mastectomy space was then inspected and meticulous hemostasis again with electrocautery.  Irrigation with saline antibiotic solution, and again hemostasis with electrocautery.  The skin closures with 3-0 Monocryl interrupted inverted deep dermal sutures.  The fill portion was then identified and additional 125 mL sterile saline placed into the closed fill system.  Total fill for both expanders 275 mL of sterile saline. Dermabond was used to seal the wounds.  A 3-0 Prolene was used to secure the drains and BioPatch with a Tegaderm for the dressing.  Dry sterile dressings applied  from the chest as well as the chest binder.  She was transferred to the recovery room in stable having tolerated the procedure well.     Crissie Reese, M.D.     DB/MEDQ  D:  10/30/2013  T:  10/31/2013  Job:  [id number]

## 2013-11-01 LAB — BASIC METABOLIC PANEL
BUN: 7 mg/dL (ref 6–23)
CO2: 28 meq/L (ref 19–32)
Calcium: 8.1 mg/dL — ABNORMAL LOW (ref 8.4–10.5)
Chloride: 99 mEq/L (ref 96–112)
Creatinine, Ser: 0.67 mg/dL (ref 0.50–1.10)
GFR calc Af Amer: >90 mL/min (ref >90)
GLUCOSE: 110 mg/dL — AB (ref 70–99)
POTASSIUM: 3.5 meq/L — AB (ref 3.7–5.3)
Sodium: 139 mEq/L (ref 137–147)

## 2013-11-01 LAB — CBC
HCT: 39.8 % (ref 36.0–46.0)
HEMOGLOBIN: 13.2 g/dL (ref 12.0–15.0)
MCH: 30.1 pg (ref 26.0–34.0)
MCHC: 33.2 g/dL (ref 30.0–36.0)
MCV: 90.9 fL (ref 78.0–100.0)
Platelets: 240 10*3/uL (ref 150–400)
RBC: 4.38 MIL/uL (ref 3.87–5.11)
RDW: 13.4 % (ref 11.5–15.5)
WBC: 11.7 10*3/uL — AB (ref 4.0–10.5)

## 2013-11-01 MED ORDER — ENOXAPARIN SODIUM 40 MG/0.4ML ~~LOC~~ SOLN: 40.0000 mg | SUBCUTANEOUS | Status: DC

## 2013-11-01 MED ORDER — METHOCARBAMOL 500 MG PO TABS: 500.0000 mg | ORAL_TABLET | Freq: Four times a day (QID) | ORAL | Status: DC

## 2013-11-01 MED ORDER — CEPHALEXIN 500 MG PO CAPS: 500.0000 mg | ORAL_CAPSULE | Freq: Two times a day (BID) | ORAL | Status: DC

## 2013-11-01 MED ORDER — MENTHOL 3 MG MT LOZG: 1.0000 | LOZENGE | OROMUCOSAL | Status: DC | PRN

## 2013-11-01 MED ORDER — OXYCODONE-ACETAMINOPHEN 5-325 MG PO TABS: 1.0000 | ORAL_TABLET | ORAL | Status: DC | PRN

## 2013-11-01 MED ORDER — DSS 100 MG PO CAPS: 100.0000 mg | ORAL_CAPSULE | Freq: Every day | ORAL | Status: DC

## 2013-11-01 MED ORDER — CEPHALEXIN 500 MG PO CAPS
500.0000 mg | ORAL_CAPSULE | Freq: Two times a day (BID) | ORAL | Status: DC
  Administered 2013-11-01: 500 mg via ORAL
  Filled 2013-11-01: qty 1

## 2013-11-01 MED ORDER — METOCLOPRAMIDE HCL 10 MG PO TABS: 10.0000 mg | ORAL_TABLET | Freq: Three times a day (TID) | ORAL | Status: DC

## 2013-11-01 NOTE — Progress Notes (Signed)
Discharge home. Home discharge instruction given, no question verbalized. 

## 2013-11-01 NOTE — Discharge Instructions (Addendum)
No lifting for 6 weeks °No vigorous activity for 6 weeks (including outdoor walks) °No driving for 4 weeks °OK to walk up stairs slowly °Stay propped up °Use incentive spirometer at home every hour while awake °No shower while drains are in place °Empty drains at least three times a day and record the amounts separately °Change drain dressings every third day if instructed to do so by Dr. Gwendlyon Zumbro ° Apply Bacitracin antibiotic ointment to the drain sites ° Place gauze dressing over drains ° Secure the gauze with tape °Take an over-the-counter Probiotic while on antibiotics °Take an over-the-counter stool softener (such as Colace) while on pain medication °See Dr Briar Sword next week as scheduled  °For questions call [phone] or [phone] ° °

## 2013-11-01 NOTE — Discharge Summary (Signed)
Physician Discharge Summary  Patient ID: Kathy Watkins MRN: [id number] DOB/AGE: [birth year] 54 y.o.  Admit date: 10/30/2013 Discharge date: 11/01/2013  Admission Diagnoses: LCIS breast  Discharge Diagnoses: Same  Active Problems:   Breast neoplasm, Tis (LCIS)   Breast cancer, stage 0   Discharged Condition: good  Hospital Course: On the day of admission the patient was taken to surgery and had bilateral mastectomy and reconstruction with tissue expanders.. The patient tolerated the procedures well. Postoperatively, the flap maintained excellent color and capillary refill. The patient was ambulatory and tolerating diet on the first postoperative day. DVT prophylaxis pre-op and post-op. No nausea. She is ready for discharge.  Treatments: antibiotics: Ancef, anticoagulation: heparin and surgery: bilateral mastectomy and bilateral breast reconstruction with tissue expanders  Discharge Exam: Blood pressure 134/64, pulse 90, temperature 98.3 F (36.8 C), temperature source Oral, resp. rate 17, height 5\' 5"  (1.651 m), weight 170 lb 8 oz (77.338 kg), SpO2 93.00%.  Operative sites: Mastectomy flaps viable. Tissue expanders appear to be in position. Drains functioning. Drainage thin.  Disposition: Discharge home   Future Appointments Provider Department Dept Phone   11/14/2013 9:00 [place], MD [facility], [place]   03/21/2014 9:00 AM Deatra Robinson, [facility] [phone]       Medication List         ALPRAZolam 0.25 MG tablet  Commonly known as:  XANAX  Take 0.25 mg by mouth 3 (three) times daily as needed for anxiety.     amLODipine 10 MG tablet  Commonly known as:  NORVASC  Take 1 tablet (10 mg total) by mouth daily.     cephALEXin 500 MG capsule  Commonly known as:  KEFLEX  Take 1 capsule (500 mg total) by mouth every 12 (twelve) hours.     DSS 100 MG Caps  Take 100 mg by mouth daily.     enoxaparin 40  MG/0.4ML injection  Commonly known as:  LOVENOX  Inject 0.4 mLs (40 mg total) into the skin daily.  Start taking on:  11/02/2013     hydrochlorothiazide 25 MG tablet  Commonly known as:  HYDRODIURIL  Take 1 tablet (25 mg total) by mouth daily.     menthol-cetylpyridinium 3 MG lozenge  Commonly known as:  CEPACOL  Take 1 lozenge (3 mg total) by mouth as needed for sore throat.     methocarbamol 500 MG tablet  Commonly known as:  ROBAXIN  Take 1 tablet (500 mg total) by mouth 4 (four) times daily.     metoCLOPramide 10 MG tablet  Commonly known as:  REGLAN  Take 1 tablet (10 mg total) by mouth 3 (three) times daily before meals.     oxyCODONE-acetaminophen 5-325 MG per tablet  Commonly known as:  PERCOCET/ROXICET  Take 1-2 tablets by mouth every 4 (four) hours as needed for moderate pain.     rosuvastatin 5 MG tablet  Commonly known as:  CRESTOR  Take 5 mg by mouth daily.         SignedHarlow Mares, Franciszek Platten M 11/01/2013, 8:25 AM

## 2013-11-01 NOTE — Progress Notes (Signed)
2 Days Post-Op  Subjective: Feels much better  Objective: Vital signs in last 24 hours: Temp:  [97.6 F (36.4 C)-98.9 F (37.2 C)] 98.3 F (36.8 C) (02/04 0616) Pulse Rate:  [90-111] 90 (02/04 0616) Resp:  [11-18] 17 (02/04 0616) BP: (99-138)/(58-66) 134/64 mmHg (02/04 0616) SpO2:  [92 %-97 %] 93 % (02/04 0616) Last BM Date: 10/29/13  Intake/Output from previous day: 02/03 0701 - 02/04 0700 In: 753.8 [I.V.:653.8; IV Piggyback:100] Out: 2343 [Urine:2200; Drains:143] Intake/Output this shift:    Resp: clear to auscultation bilaterally Chest wall: skin flaps look good Cardio: regular rate and rhythm GI: soft, non-tender; bowel sounds normal; no masses,  no organomegaly  Lab Results:   Recent Labs  10/31/13 0710 11/01/13 0450  WBC 11.7* 11.7*  HGB 12.6 13.2  HCT 37.8 39.8  PLT 226 240   BMET  Recent Labs  10/31/13 0710 11/01/13 0450  NA 138 139  K 3.7 3.5*  CL 102 99  CO2 22 28  GLUCOSE 170* 110*  BUN 11 7  CREATININE 0.56 0.67  CALCIUM 8.1* 8.1*   PT/INR No results found for this basename: LABPROT, INR,  in the last 72 hours ABG No results found for this basename: PHART, PCO2, PO2, HCO3,  in the last 72 hours  Studies/Results: No results found.  Anti-infectives: Anti-infectives   Start     Dose/Rate Route Frequency Ordered Stop   11/01/13 1000  cephALEXin (KEFLEX) capsule 500 mg     500 mg Oral Every 12 hours 11/01/13 0758     11/01/13 0000  cephALEXin (KEFLEX) 500 MG capsule     500 mg Oral Every 12 hours 11/01/13 0823     10/30/13 1700  ceFAZolin (ANCEF) IVPB 1 g/50 mL premix  Status:  Discontinued     1 g 100 mL/hr over 30 Minutes Intravenous 4 times per day 10/30/13 1403 11/01/13 0758   10/30/13 0957  neomycin-polymyxin B (NEOSPORIN) 3 mL, bacitracin 50,000 Units in sodium chloride irrigation 0.9 % 1,000 mL irrigation  Status:  Discontinued       As needed 10/30/13 1013 10/30/13 1148   10/30/13 0830  bacitracin 50,000 Units, gentamicin  (GARAMYCIN) 80 mg, ceFAZolin (ANCEF) 1 g in sodium chloride 0.9 % 1,000 mL  Status:  Discontinued      Irrigation To Surgery 10/30/13 0817 10/30/13 1344   10/30/13 0636  ceFAZolin (ANCEF) IVPB 2 g/50 mL premix  Status:  Discontinued     2 g 100 mL/hr over 30 Minutes Intravenous On call to O.R. 10/30/13 0636 10/30/13 1344   10/30/13 0600  ceFAZolin (ANCEF) IVPB 2 g/50 mL premix     2 g 100 mL/hr over 30 Minutes Intravenous On call to O.R. 10/29/13 1310 10/30/13 1113      Assessment/Plan: s/p Procedure(s) with comments: BILATERAL MASTECTOMY  (Bilateral) - Bilateral  BILATERAL BREAST RECONSTRUCTION WITH PLACEMENT OF TISSUE EXPANDER AND POSSIBLE USE OF FLEX HD (ACELLULAR HYDRATED DERMIS) (Bilateral) Advance diet Discharge  LOS: 2 days    TOTH III,Velena Keegan S 11/01/2013

## 2013-11-10 ENCOUNTER — Telehealth (INDEPENDENT_AMBULATORY_CARE_PROVIDER_SITE_OTHER): Payer: Self-pay

## 2013-11-10 NOTE — Telephone Encounter (Signed)
Message copied by Carlene Coria on Fri Nov 10, 2013 10:55 AM ------      Message from: Luella Cook III      Created: Wed Nov 08, 2013  1:36 PM       No invasive cancer. Margins are clean ------

## 2013-11-10 NOTE — Telephone Encounter (Signed)
LM> path showed no invasive cancer and she has clean margins

## 2013-11-14 ENCOUNTER — Encounter (INDEPENDENT_AMBULATORY_CARE_PROVIDER_SITE_OTHER): Payer: BC Managed Care – PPO | Admitting: General Surgery

## 2013-11-20 ENCOUNTER — Encounter (INDEPENDENT_AMBULATORY_CARE_PROVIDER_SITE_OTHER): Payer: Self-pay | Admitting: General Surgery

## 2013-11-20 ENCOUNTER — Ambulatory Visit (INDEPENDENT_AMBULATORY_CARE_PROVIDER_SITE_OTHER): Payer: BC Managed Care – PPO | Admitting: General Surgery

## 2013-11-20 VITALS — BP 112/80 | HR 88 | Temp 98.1°F | Resp 14 | Ht 65.0 in | Wt 169.4 lb

## 2013-11-20 DIAGNOSIS — D0500 Lobular carcinoma in situ of unspecified breast: Secondary | ICD-10-CM

## 2013-11-20 DIAGNOSIS — D0590 Unspecified type of carcinoma in situ of unspecified breast: Secondary | ICD-10-CM

## 2013-11-20 NOTE — Progress Notes (Signed)
Subjective:     Patient ID: Kathy Watkins, female   DOB: [birth year], 53 y.o.   MRN: [id number]  HPI The patient is a 53 year old white female who is 3 weeks status post bilateral mastectomies for right-sided LCIS. She has done well since the surgery. Her only complaint is of feeling tired. She denies any chest wall pain.  Review of Systems     Objective:   Physical Exam On exam both of her mastectomy incisions are healing nicely with no sign of infection or significant seroma. Her skin flaps are healthy    Assessment:     The patient is 3 weeks status post bilateral mastectomies for right-sided LCIS     Plan:     At this point she will continue to followup with Dr. Harlow Mares for the reconstruction. She will continue to do regular self exams. I will plan to see her back in about 3 months.

## 2013-11-20 NOTE — Patient Instructions (Signed)
Continue regular self exams  

## 2013-11-27 ENCOUNTER — Encounter: Payer: BC Managed Care – PPO | Admitting: Genetic Counselor

## 2013-12-20 ENCOUNTER — Encounter: Payer: Self-pay | Admitting: Family Medicine

## 2014-01-15 ENCOUNTER — Other Ambulatory Visit: Payer: Self-pay | Admitting: Family Medicine

## 2014-01-30 ENCOUNTER — Other Ambulatory Visit: Payer: Self-pay | Admitting: Plastic Surgery

## 2014-02-16 ENCOUNTER — Other Ambulatory Visit: Payer: Self-pay | Admitting: Family Medicine

## 2014-02-26 ENCOUNTER — Ambulatory Visit (INDEPENDENT_AMBULATORY_CARE_PROVIDER_SITE_OTHER): Payer: BC Managed Care – PPO | Admitting: General Surgery

## 2014-02-26 ENCOUNTER — Encounter (INDEPENDENT_AMBULATORY_CARE_PROVIDER_SITE_OTHER): Payer: Self-pay | Admitting: General Surgery

## 2014-02-26 VITALS — BP 132/82 | HR 78 | Temp 97.2°F | Ht 65.0 in | Wt 169.0 lb

## 2014-02-26 DIAGNOSIS — D0500 Lobular carcinoma in situ of unspecified breast: Secondary | ICD-10-CM

## 2014-02-26 DIAGNOSIS — D0590 Unspecified type of carcinoma in situ of unspecified breast: Secondary | ICD-10-CM

## 2014-02-26 NOTE — Patient Instructions (Signed)
Continue regular self exams  

## 2014-02-26 NOTE — Progress Notes (Signed)
Subjective:     Patient ID: Kathy Watkins, female   DOB: 1961-01-15, 53 y.o.   MRN: [id number]  HPI The patient is a 53 year old white female who is 4 months status post bilateral mastectomies for right-sided LCIS. She is also on months status post exchange of her tissue expander for implants. She tolerated the surgery well. Her only complaint is that she has a couple areas on the left breast that she is not happy with. She is also upset that insurance does not seem to be covering the procedures associated with the reconstruction to make it look appropriate  Review of Systems  Constitutional: Negative.   HENT: Negative.   Eyes: Negative.   Respiratory: Negative.   Cardiovascular: Negative.   Gastrointestinal: Negative.   Endocrine: Negative.   Genitourinary: Negative.   Musculoskeletal: Negative.   Skin: Negative.   Allergic/Immunologic: Negative.   Neurological: Negative.   Hematological: Negative.   Psychiatric/Behavioral: Negative.        Objective:   Physical Exam  Constitutional: She is oriented to person, place, and time. She appears well-developed and well-nourished.  HENT:  Head: Normocephalic and atraumatic.  Eyes: Conjunctivae and EOM are normal. Pupils are equal, round, and reactive to light.  Neck: Normal range of motion. Neck supple.  Cardiovascular: Normal rate, regular rhythm and normal heart sounds.   Pulmonary/Chest: Effort normal and breath sounds normal.  There is no palpable mass of the the reconstructed breast. There is no palpable axillary, supraclavicular, or cervical lymphadenopathy  Abdominal: Soft. Bowel sounds are normal.  Musculoskeletal: Normal range of motion.  Lymphadenopathy:    She has no cervical adenopathy.  Neurological: She is alert and oriented to person, place, and time.  Skin: Skin is warm and dry.  Psychiatric: She has a normal mood and affect. Her behavior is normal.       Assessment:     The patient is 4 months status post  bilateral mastectomies for right-sided LCIS     Plan:     At this point she will continue to do regular self exams. I will plan to see her back in about 6 months.

## 2014-03-05 ENCOUNTER — Ambulatory Visit (INDEPENDENT_AMBULATORY_CARE_PROVIDER_SITE_OTHER): Payer: BC Managed Care – PPO | Admitting: Family Medicine

## 2014-03-05 ENCOUNTER — Encounter: Payer: Self-pay | Admitting: Family Medicine

## 2014-03-05 VITALS — BP 112/82 | HR 93 | Temp 98.2°F | Wt 172.0 lb

## 2014-03-05 DIAGNOSIS — I10 Essential (primary) hypertension: Secondary | ICD-10-CM

## 2014-03-05 DIAGNOSIS — E785 Hyperlipidemia, unspecified: Secondary | ICD-10-CM

## 2014-03-05 NOTE — Progress Notes (Signed)
Pre visit review using our clinic review tool, if applicable. No additional management support is needed unless otherwise documented below in the visit note. 

## 2014-03-05 NOTE — Progress Notes (Signed)
  Subjective:    Patient here for follow-up of elevated blood pressure.  She is not exercising and is adherent to a low-salt diet.  Blood pressure is well controlled at home. Cardiac symptoms: none. Patient denies: chest pain, chest pressure/discomfort, claudication, dyspnea, exertional chest pressure/discomfort, fatigue, irregular heart beat, lower extremity edema, near-syncope, orthopnea, palpitations, paroxysmal nocturnal dyspnea, syncope and tachypnea. Cardiovascular risk factors: dyslipidemia, hypertension and sedentary lifestyle. Use of agents associated with hypertension: none. History of target organ damage: none.  The following portions of the patient's history were reviewed and updated as appropriate: allergies, current medications, past family history, past medical history, past social history, past surgical history and problem list.  Review of Systems Pertinent items are noted in HPI.     Objective:    BP 112/82  Pulse 93  Temp(Src) 98.2 F (36.8 C) (Oral)  Wt 172 lb (78.019 kg)  SpO2 96% General appearance: alert, cooperative, appears stated age and no distress Throat: lips, mucosa, and tongue normal; teeth and gums normal Neck: no adenopathy, supple, symmetrical, trachea midline and thyroid not enlarged, symmetric, no tenderness/mass/nodules Lungs: clear to auscultation bilaterally Heart: S1, S2 normal Extremities: extremities normal, atraumatic, no cyanosis or edema    Assessment:    Hypertension, normal blood pressure . Evidence of target organ damage: none.    Plan:    Medication: no change. Dietary sodium restriction. Regular aerobic exercise. Check blood pressures 2-3 times weekly and record. Follow up: 6 months and as needed.

## 2014-03-05 NOTE — Patient Instructions (Addendum)

## 2014-03-06 ENCOUNTER — Telehealth: Payer: Self-pay | Admitting: Family Medicine

## 2014-03-06 NOTE — Telephone Encounter (Signed)
Relevant patient education assigned to patient using Emmi. ° °

## 2014-03-08 ENCOUNTER — Other Ambulatory Visit: Payer: BC Managed Care – PPO

## 2014-03-08 LAB — HEPATIC FUNCTION PANEL
ALBUMIN: 3.7 g/dL (ref 3.5–5.2)
ALT: 27 U/L (ref 0–35)
AST: 20 U/L (ref 0–37)
Alkaline Phosphatase: 58 U/L (ref 39–117)
Bilirubin, Direct: 0 mg/dL (ref 0.0–0.3)
Total Bilirubin: 0.4 mg/dL (ref 0.2–1.2)
Total Protein: 7.2 g/dL (ref 6.0–8.3)

## 2014-03-08 LAB — LIPID PANEL
Cholesterol: 182 mg/dL (ref 0–200)
HDL: 51 mg/dL (ref >39.00)
LDL Cholesterol: 111 mg/dL — ABNORMAL HIGH (ref 0–99)
NonHDL: 131
Total CHOL/HDL Ratio: 4
Triglycerides: 102 mg/dL (ref 0.0–149.0)
VLDL: 20.4 mg/dL (ref 0.0–40.0)

## 2014-03-08 LAB — BASIC METABOLIC PANEL
BUN: 15 mg/dL (ref 6–23)
CO2: 29 mEq/L (ref 19–32)
Calcium: 9.3 mg/dL (ref 8.4–10.5)
Chloride: 103 mEq/L (ref 96–112)
Creatinine, Ser: 0.8 mg/dL (ref 0.4–1.2)
GFR: 84.25 mL/min (ref >60.00)
GLUCOSE: 87 mg/dL (ref 70–99)
POTASSIUM: 3.6 meq/L (ref 3.5–5.1)
Sodium: 138 mEq/L (ref 135–145)

## 2014-03-09 ENCOUNTER — Telehealth: Payer: Self-pay

## 2014-03-09 DIAGNOSIS — E785 Hyperlipidemia, unspecified: Secondary | ICD-10-CM

## 2014-03-09 MED ORDER — ROSUVASTATIN CALCIUM 10 MG PO TABS: 10.0000 mg | ORAL_TABLET | Freq: Every day | ORAL | Status: DC

## 2014-03-09 NOTE — Telephone Encounter (Signed)
Patient notified. Orders entered. Rx sent to pharmacy

## 2014-03-09 NOTE — Telephone Encounter (Signed)
Message copied by Reino Bellis on Fri Mar 09, 2014  3:52 PM ------      Message from: Rosalita Chessman      Created: Thu Mar 08, 2014  9:18 PM       Cholesterol--- LDL goal < 100,  HDL >40,  TG < 150.  Diet and exercise will increase HDL and decrease LDL and TG.  Fish,  Fish Oil, Flaxseed oil will also help increase the HDL and decrease Triglycerides.   Recheck labs in 3 months---272.4  Lipid, hep---  Increase crestor 10 mg daily.             ------

## 2014-03-15 ENCOUNTER — Telehealth: Payer: Self-pay | Admitting: Hematology and Oncology

## 2014-03-15 NOTE — Telephone Encounter (Signed)
, °

## 2014-03-21 ENCOUNTER — Ambulatory Visit: Payer: BC Managed Care – PPO | Admitting: Oncology

## 2014-04-16 ENCOUNTER — Encounter: Payer: Self-pay | Admitting: Family Medicine

## 2014-04-17 ENCOUNTER — Encounter: Payer: Self-pay | Admitting: Internal Medicine

## 2014-04-17 ENCOUNTER — Ambulatory Visit (INDEPENDENT_AMBULATORY_CARE_PROVIDER_SITE_OTHER): Payer: BC Managed Care – PPO | Admitting: Internal Medicine

## 2014-04-17 VITALS — BP 110/80 | HR 94 | Temp 98.2°F | Wt 175.0 lb

## 2014-04-17 DIAGNOSIS — R42 Dizziness and giddiness: Secondary | ICD-10-CM

## 2014-04-17 NOTE — Progress Notes (Signed)
Subjective:    Patient ID: Kathy Watkins, female    DOB: 06-24-1961, 53 y.o.   MRN: [id number]  DOS:  04/17/2014 Type of visit - description: acute History:  Symptoms started 10 days ago: got a little dizzy after waking up , 2 days later she turn her head and immediately got quite dizzy (spinning), had nausea and vomited x 4. Did not feel well the rest of the day. Since then has similar dizziness w/o nausea mostly first thing in the morning when she gets up or turn her head. Rest of the day is ok We checked orthostatic VS --->  stable (HR slt increased when stood up, BP ok)   ROS No fever, chills No sinus pain or congestion No  diarrhea, blood in the stools  No headaches Denies diplopia, slurred speech, motor deficits, facial numbnes   Past Medical History  Diagnosis Date  . Hypertension   . Hyperlipidemia   . Colon polyps   . Atypical ductal hyperplasia of breast   . Anxiety   . Lobular carcinoma in situ     Hx: of 2015  . PONV (postoperative nausea and vomiting)   . Breast cancer     "right only" (10/30/2013)    Past Surgical History  Procedure Laterality Date  . Abdominal hysterectomy  1994    adenomyosis  . Parotid gland tumor excision  2000  . Colonoscopy w/ biopsies and polypectomy      Hx:of  . Dilation and curettage of uterus    . Breast reconstruction with placement of tissue expander and flex hd (acellular hydrated dermis) Bilateral 10/30/2013  . Mastectomy Bilateral 10/30/2013  . Tubal ligation  1989  . Breast lumpectomy Left 2008     ADH  . Breast biopsy Left 2008  . Breast biopsy Right   . Mastectomy, modified radical w/reconstruction Bilateral 10/30/2013    Procedure: BILATERAL MASTECTOMY ;  Surgeon: Luella Cook III, MD;  Location: [facility];  Service: General;  Laterality: Bilateral;  Bilateral   . Breast reconstruction with placement of tissue expander and flex hd (acellular hydrated dermis) Bilateral 10/30/2013    Procedure: BILATERAL BREAST RECONSTRUCTION  WITH PLACEMENT OF TISSUE EXPANDER AND POSSIBLE USE OF FLEX HD (ACELLULAR HYDRATED DERMIS);  Surgeon: Crissie Reese, MD;  Location: [facility];  Service: Plastics;  Laterality: Bilateral;    History   Social History  . Marital Status: Married    Spouse Name: N/A    Number of Children: 2  . Years of Education: N/A   Occupational History  . [employer].    Social History Main Topics  . Smoking status: Never Smoker   . Smokeless tobacco: Never Used  . Alcohol Use: 1.2 oz/week    2 Glasses of wine per week  . Drug Use: No  . Sexual Activity: Yes    Partners: Male   Other Topics Concern  . Not on file   Social History Narrative   Exercise-- walks 5 nights a week 3 miles        Medication List       This list is accurate as of: 04/17/14  6:19 PM.  Always use your most recent med list.               ALPRAZolam 0.25 MG tablet  Commonly known as:  XANAX  Take 0.25 mg by mouth 3 (three) times daily as needed for anxiety.     amLODipine 10 MG tablet  Commonly known as:  NORVASC  TAKE 1 TABLET (10 MG TOTAL) BY MOUTH DAILY.     hydrochlorothiazide 25 MG tablet  Commonly known as:  HYDRODIURIL  1 TAB BY MOUTH DAILY--OFFICE VISIT DUE NOW     methocarbamol 500 MG tablet  Commonly known as:  ROBAXIN  Take 1 tablet (500 mg total) by mouth 4 (four) times daily.     rosuvastatin 10 MG tablet  Commonly known as:  CRESTOR  Take 1 tablet (10 mg total) by mouth daily.           Objective:   Physical Exam BP 110/80  Pulse 94  Temp(Src) 98.2 F (36.8 C)  Wt 175 lb (79.379 kg)  SpO2 99% General -- alert, well-developed, NAD.  Neck -- normal carotid pulse  HEENT-- Not pale.  Lungs -- normal respiratory effort, no intercostal retractions, no accessory muscle use, and normal breath sounds.  Heart-- normal rate, regular rhythm, no murmur.   Extremities-- no pretibial edema bilaterally  Neurologic--  alert & oriented X3. Speech normal, gait appropriate for age, strength  symmetric and appropriate for age.  DTRs symmetric. EOMI, PERLA   Psych-- Cognition and judgment appear intact. Cooperative with normal attention span and concentration. No anxious or depressed appearing.        Assessment & Plan:   Dizziness,  Peripheral features; discussed labs-CT head vs observation. Also ANTIVERT We elected rest-fluids-observation Will call if sx severe or different, see instructions

## 2014-04-17 NOTE — Patient Instructions (Signed)
Rest, drink plenty of fluids If not gradually improving in the next 10 days call us. If severe symptoms, fever, headache, visual disturbances : call us as well

## 2014-04-17 NOTE — Progress Notes (Signed)
Pre visit review using our clinic review tool, if applicable. No additional management support is needed unless otherwise documented below in the visit note. 

## 2014-05-25 ENCOUNTER — Ambulatory Visit (INDEPENDENT_AMBULATORY_CARE_PROVIDER_SITE_OTHER): Payer: BC Managed Care – PPO | Admitting: Family Medicine

## 2014-05-25 ENCOUNTER — Encounter: Payer: Self-pay | Admitting: Family Medicine

## 2014-05-25 VITALS — BP 105/78 | HR 85 | Temp 98.1°F | Ht 60.5 in | Wt 177.4 lb

## 2014-05-25 DIAGNOSIS — L0291 Cutaneous abscess, unspecified: Secondary | ICD-10-CM

## 2014-05-25 DIAGNOSIS — L0390 Cellulitis, unspecified: Secondary | ICD-10-CM

## 2014-05-25 MED ORDER — AMOXICILLIN-POT CLAVULANATE 875-125 MG PO TABS: 1.0000 | ORAL_TABLET | Freq: Two times a day (BID) | ORAL | Status: DC

## 2014-05-25 NOTE — Progress Notes (Signed)
   Subjective:    Patient ID: Kathy Watkins, female    DOB: [birth year], 53 y.o.   MRN: [id number]  HPI Pt here c/o abscess on L thigh--- it drained a few days ago but stopped and now is painful.  No fever.   Review of Systems As above    Objective:   Physical Exam  BP 105/78  Pulse 85  Temp(Src) 98.1 F (36.7 C) (Oral)  Ht 5' 0.5" (1.537 m)  Wt 177 lb 6.4 oz (80.468 kg)  BMI 34.06 kg/m2  SpO2 98% General appearance: alert, cooperative, appears stated age and no distress Skin: + abscess L thigh-- about 2 in in diameter, tender to touch, no drainage       Assessment & Plan:  1. Abscess Warm compresses with epson salt - amoxicillin-clavulanate (AUGMENTIN) 875-125 MG per tablet; Take 1 tablet by mouth 2 (two) times daily.  Dispense: 20 tablet; Refill: 0 If no improvement or if worsens-- will refer to surgeon

## 2014-05-25 NOTE — Patient Instructions (Signed)

## 2014-05-25 NOTE — Progress Notes (Signed)
Pre visit review using our clinic review tool, if applicable. No additional management support is needed unless otherwise documented below in the visit note. 

## 2014-05-30 ENCOUNTER — Other Ambulatory Visit: Payer: Self-pay | Admitting: Obstetrics and Gynecology

## 2014-05-30 ENCOUNTER — Encounter: Payer: Self-pay | Admitting: Family Medicine

## 2014-05-30 DIAGNOSIS — L02415 Cutaneous abscess of right lower limb: Secondary | ICD-10-CM

## 2014-05-30 NOTE — Telephone Encounter (Signed)
Refer to surgery 

## 2014-05-31 LAB — CYTOLOGY - PAP

## 2014-06-07 ENCOUNTER — Ambulatory Visit (HOSPITAL_BASED_OUTPATIENT_CLINIC_OR_DEPARTMENT_OTHER): Payer: BC Managed Care – PPO | Admitting: Hematology and Oncology

## 2014-06-07 ENCOUNTER — Encounter: Payer: Self-pay | Admitting: Hematology and Oncology

## 2014-06-07 VITALS — BP 114/57 | HR 85 | Temp 98.2°F | Resp 18 | Ht 60.5 in | Wt 179.4 lb

## 2014-06-07 DIAGNOSIS — D0590 Unspecified type of carcinoma in situ of unspecified breast: Secondary | ICD-10-CM

## 2014-06-07 DIAGNOSIS — Z853 Personal history of malignant neoplasm of breast: Secondary | ICD-10-CM

## 2014-06-07 DIAGNOSIS — Z23 Encounter for immunization: Secondary | ICD-10-CM

## 2014-06-07 DIAGNOSIS — D0501 Lobular carcinoma in situ of right breast: Secondary | ICD-10-CM

## 2014-06-07 MED ORDER — INFLUENZA VAC SPLIT QUAD 0.5 ML IM SUSY
0.5000 mL | PREFILLED_SYRINGE | Freq: Once | INTRAMUSCULAR | Status: AC
  Administered 2014-06-07: 0.5 mL via INTRAMUSCULAR
  Filled 2014-06-07: qty 0.5

## 2014-06-07 NOTE — Progress Notes (Signed)
Patient Care Team: Rosalita Chessman, DO as PCP - General  DIAGNOSIS: No matching staging information was found for the patient.  SUMMARY OF ONCOLOGIC HISTORY: Oncology History       Breast neoplasm, Tis (LCIS)   08/07/2013 Breast MRI Right breast: Clumped enhancement measuring 5 x 2.4 x 2.2 cm: Left breast moderate background enhancement without any masses   08/18/2013 Initial Diagnosis Breast neoplasm, Tis (LCIS)   10/30/2013 Surgery Bilateral mastectomies: Right breast: lobular neoplasia, in situ carcinoma and atypical hyperplasia: Left breast-benign, 3 lymph nodes negative    Anti-estrogen oral therapy Patient refused tamoxifen for risk reduction    CHIEF COMPLIANT: No new complaints or concerns six-month followup for her history of LCIS  INTERVAL HISTORY: [name] is a 53 year old Caucasian lady with above-mentioned history of LCIS involving the right breast. Because of her family history she elected to do bilateral mastectomies. She is currently undergoing reconstruction on has one more surgery coming up. She denies any lumps or nodules in the breast. She was offered tamoxifen therapy for breast reduction but she refused because of side effects and the fact that she had bilateral mastectomies.   REVIEW OF SYSTEMS:   Constitutional: Denies fevers, chills or abnormal weight loss Eyes: Denies blurriness of vision Ears, nose, mouth, throat, and face: Denies mucositis or sore throat Respiratory: Denies cough, dyspnea or wheezes Cardiovascular: Denies palpitation, chest discomfort or lower extremity swelling Gastrointestinal:  Denies nausea, heartburn or change in bowel habits Skin: Denies abnormal skin rashes Lymphatics: Denies new lymphadenopathy or easy bruising Neurological:Denies numbness, tingling or new weaknesses Behavioral/Psych: Mood is stable, no new changes  Breast: Reconstruction of the breasts is going on. All other systems were reviewed with the patient and are  negative.  I have reviewed the past medical history, past surgical history, social history and family history with the patient and they are unchanged from previous note.  ALLERGIES:  has No Known Allergies.  MEDICATIONS:  Current Outpatient Prescriptions  Medication Sig Dispense Refill  . ALPRAZolam (XANAX) 0.25 MG tablet Take 0.25 mg by mouth 3 (three) times daily as needed for anxiety.      Marland Kitchen amLODipine (NORVASC) 10 MG tablet TAKE 1 TABLET (10 MG TOTAL) BY MOUTH DAILY.  90 tablet  0  . hydrochlorothiazide (HYDRODIURIL) 25 MG tablet 1 TAB BY MOUTH DAILY--OFFICE VISIT DUE NOW  90 tablet  0  . rosuvastatin (CRESTOR) 10 MG tablet Take 1 tablet (10 mg total) by mouth daily.  90 tablet  1   Current Facility-Administered Medications  Medication Dose Route Frequency Provider Last Rate Last Dose  . Influenza vac split quadrivalent PF (FLUARIX) injection 0.5 mL  0.5 mL Intramuscular Once Rulon Eisenmenger, MD        PHYSICAL EXAMINATION: ECOG PERFORMANCE STATUS: 0 - Asymptomatic  Filed Vitals:   06/07/14 0832  BP: 114/57  Pulse: 85  Temp: 98.2 F (36.8 C)  Resp: 18   Filed Weights   06/07/14 0832  Weight: 179 lb 6.4 oz (81.375 kg)    GENERAL:alert, no distress and comfortable SKIN: skin color, texture, turgor are normal, no rashes or significant lesions EYES: normal, Conjunctiva are pink and non-injected, sclera clear OROPHARYNX:no exudate, no erythema and lips, buccal mucosa, and tongue normal  NECK: supple, thyroid normal size, non-tender, without nodularity LYMPH:  no palpable lymphadenopathy in the cervical, axillary or inguinal LUNGS: clear to auscultation and percussion with normal breathing effort HEART: regular rate & rhythm and no murmurs and no lower extremity edema  ABDOMEN:abdomen soft, non-tender and normal bowel sounds Musculoskeletal:no cyanosis of digits and no clubbing  NEURO: alert & oriented x 3 with fluent speech, no focal motor/sensory deficits   LABORATORY  DATA:  I have reviewed the data as listed   Chemistry      Component Value Date/Time   NA 138 03/08/2014 0851   K 3.6 03/08/2014 0851   CL 103 03/08/2014 0851   CO2 29 03/08/2014 0851   BUN 15 03/08/2014 0851   CREATININE 0.8 03/08/2014 0851      Component Value Date/Time   CALCIUM 9.3 03/08/2014 0851   ALKPHOS 58 03/08/2014 0851   AST 20 03/08/2014 0851   ALT 27 03/08/2014 0851   BILITOT 0.4 03/08/2014 0851       Lab Results  Component Value Date   WBC 11.7* 11/01/2013   HGB 13.2 11/01/2013   HCT 39.8 11/01/2013   MCV 90.9 11/01/2013   PLT 240 11/01/2013   NEUTROABS 5.7 09/11/2013     RADIOGRAPHIC STUDIES: I have personally reviewed the radiology reports and agreed with their findings. No results found.   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 She was recommended tamoxifen therapy but patient refused because of concerns for side effects. She has no new problems or concerns and is here for routine checkup. There is no indication to do surveillance imaging since she had bilateral mastectomies.  I discussed that we will see her back in one year for followup. If she has no further problems she may be been seen as needed. There is no indication to blood testing but instructed her to continue with her regular checkups. She has had colonoscopies, risk tests and she stays on top of for healthcare issues including hyperlipidemia.  No orders of the defined types were placed in this encounter.   The patient has a good understanding of the overall plan. she agrees with it. She will call with any problems that may develop before her next visit here.  I spent 15 minutes counseling the patient face to face. The total time spent in the appointment was 20 minutes and more than 50% was on counseling and review of test results    Rulon Eisenmenger, MD 06/07/2014 9:04 AM

## 2014-06-07 NOTE — Assessment & Plan Note (Signed)
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 She was recommended tamoxifen therapy but patient refused because of concerns for side effects. She has no new problems or concerns and is here for routine checkup. There is no indication to do surveillance imaging since she had bilateral mastectomies.

## 2014-06-08 ENCOUNTER — Telehealth: Payer: Self-pay | Admitting: Hematology and Oncology

## 2014-06-08 NOTE — Telephone Encounter (Signed)
s.w. pt and advised on Sept 2016 appt.Marland KitchenMarland KitchenMarland KitchenMarland Kitchenpt ok and aware

## 2014-06-09 ENCOUNTER — Other Ambulatory Visit: Payer: Self-pay | Admitting: Family Medicine

## 2014-06-19 ENCOUNTER — Telehealth: Payer: Self-pay | Admitting: *Deleted

## 2014-06-19 NOTE — Telephone Encounter (Signed)
Order for bras faxed to [facility], sent to scan.

## 2014-08-30 ENCOUNTER — Ambulatory Visit (INDEPENDENT_AMBULATORY_CARE_PROVIDER_SITE_OTHER): Payer: BC Managed Care – PPO | Admitting: Family Medicine

## 2014-08-30 ENCOUNTER — Encounter: Payer: Self-pay | Admitting: Family Medicine

## 2014-08-30 ENCOUNTER — Ambulatory Visit (HOSPITAL_BASED_OUTPATIENT_CLINIC_OR_DEPARTMENT_OTHER)
Admission: RE | Admit: 2014-08-30 | Discharge: 2014-08-30 | Disposition: A | Discharge status: Home / Self Care | Payer: BC Managed Care – PPO | Source: Ambulatory Visit | Attending: Family Medicine | Admitting: Family Medicine

## 2014-08-30 VITALS — BP 110/78 | HR 85 | Temp 99.0°F | Wt 176.8 lb

## 2014-08-30 DIAGNOSIS — N200 Calculus of kidney: Secondary | ICD-10-CM | POA: Diagnosis not present

## 2014-08-30 DIAGNOSIS — M545 Low back pain, unspecified: Secondary | ICD-10-CM

## 2014-08-30 DIAGNOSIS — I70209 Unspecified atherosclerosis of native arteries of extremities, unspecified extremity: Secondary | ICD-10-CM | POA: Insufficient documentation

## 2014-08-30 DIAGNOSIS — M419 Scoliosis, unspecified: Secondary | ICD-10-CM | POA: Insufficient documentation

## 2014-08-30 MED ORDER — MELOXICAM 15 MG PO TABS: ORAL_TABLET | ORAL | Status: DC

## 2014-08-30 MED ORDER — METHOCARBAMOL 500 MG PO TABS: 500.0000 mg | ORAL_TABLET | Freq: Four times a day (QID) | ORAL | Status: DC | PRN

## 2014-08-30 NOTE — Patient Instructions (Signed)
Back Pain, Adult °Back pain is very common. The pain often gets better over time. The cause of back pain is usually not dangerous. Most people can learn to manage their back pain on their own.  °HOME CARE  °· Stay active. Start with short walks on flat ground if you can. Try to walk farther each day. °· Do not sit, drive, or stand in one place for more than 30 minutes. Do not stay in bed. °· Do not avoid exercise or work. Activity can help your back heal faster. °· Be careful when you bend or lift an object. Bend at your knees, keep the object close to you, and do not twist. °· Sleep on a firm mattress. Lie on your side, and bend your knees. If you lie on your back, put a pillow under your knees. °· Only take medicines as told by your doctor. °· Put ice on the injured area. °¨ Put ice in a plastic bag. °¨ Place a towel between your skin and the bag. °¨ Leave the ice on for 15-20 minutes, 03-04 times a day for the first 2 to 3 days. After that, you can switch between ice and heat packs. °· Ask your doctor about back exercises or massage. °· Avoid feeling anxious or stressed. Find good ways to deal with stress, such as exercise. °GET HELP RIGHT AWAY IF:  °· Your pain does not go away with rest or medicine. °· Your pain does not go away in 1 week. °· You have new problems. °· You do not feel well. °· The pain spreads into your legs. °· You cannot control when you poop (bowel movement) or pee (urinate). °· Your arms or legs feel weak or lose feeling (numbness). °· You feel sick to your stomach (nauseous) or throw up (vomit). °· You have belly (abdominal) pain. °· You feel like you may pass out (faint). °MAKE SURE YOU:  °· Understand these instructions. °· Will watch your condition. °· Will get help right away if you are not doing well or get worse. °Document Released: 03/02/2008 Document Revised: 12/07/2011 Document Reviewed: 01/16/2014 °[employer]® Patient Information ©2015 [employer]. This information is not intended  to replace advice given to you by your health care provider. Make sure you discuss any questions you have with your health care provider. ° °

## 2014-08-30 NOTE — Progress Notes (Signed)
  Subjective:    Kathy Watkins is a 53 y.o. female who presents for evaluation of low back pain. The patient has had no prior back problems. Symptoms have been present for 2 months and are gradually worsening.  Onset was related to / precipitated by no known injury. The pain is located in the left lumbar area and does not radiate. The pain is described as aching and sharp and occurs intermittently and upon awakening. She rates her pain as a 10 on a scale of 0-10. Symptoms are exacerbated by lying down, sneezing, standing and walking. Symptoms are improved by acetaminophen, change in body position, heat, ice, muscle relaxants, NSAIDs and rest. She has also tried back and body which provided no symptom relief. She has no other symptoms associated with the back pain. The patient has no "red flag" history indicative of complicated back pain.  The following portions of the patient's history were reviewed and updated as appropriate: allergies, current medications, past family history, past medical history, past social history, past surgical history and problem list.  Review of Systems Pertinent items are noted in HPI.    Objective:   Inspection and palpation: inspection of back is normal, antalgic gait, pain to left of low spine. Muscle tone and ROM exam: muscle tone normal without spasm. Straight leg raise: negative at 90 degrees bilaterally. Neurological: normal DTRs, muscle strength and reflexes.    Assessment:    Nonspecific acute low back pain    Plan:    Natural history and expected course discussed. Questions answered. Stretching exercises discussed. Regular aerobic and trunk strengthening exercises discussed. Short (2-4 day) period of relative rest recommended until acute symptoms improve. Ice to affected area as needed for local pain relief. Heat to affected area as needed for local pain relief. NSAIDs per medication orders. Muscle relaxants per medication orders. xray low back

## 2014-08-30 NOTE — Progress Notes (Signed)
Pre visit review using our clinic review tool, if applicable. No additional management support is needed unless otherwise documented below in the visit note. 

## 2014-09-03 ENCOUNTER — Other Ambulatory Visit: Payer: Self-pay

## 2014-09-03 DIAGNOSIS — M545 Low back pain, unspecified: Secondary | ICD-10-CM

## 2014-09-04 ENCOUNTER — Other Ambulatory Visit: Payer: Self-pay | Admitting: Family Medicine

## 2014-09-04 DIAGNOSIS — M545 Low back pain, unspecified: Secondary | ICD-10-CM

## 2014-10-06 ENCOUNTER — Telehealth: Payer: Self-pay | Admitting: Family Medicine

## 2014-10-08 NOTE — Telephone Encounter (Signed)
Patient given 30 day supply of Crestor with no refills.  Patient due for office visit with fasting labs.  Please schedule.       EAL

## 2014-10-09 ENCOUNTER — Other Ambulatory Visit: Payer: Self-pay | Admitting: Plastic Surgery

## 2014-10-09 NOTE — Telephone Encounter (Signed)
Left message for patient to return my call.

## 2014-10-10 NOTE — Telephone Encounter (Signed)
Patient returned phone call and scheduled appointment

## 2014-10-18 ENCOUNTER — Telehealth: Payer: Self-pay | Admitting: Genetic Counselor

## 2014-10-18 NOTE — Telephone Encounter (Signed)
[facility] sent an email that patient's insurance was expired when they tried to bill for her genetic test result back in October 14, 2013 (one year ago).  They asked if we had updated insurance on file.  We do not.  I questioned why it took one year to figure this out, and why their date of service is being billed after the date of the results being completed.  Mr. Kathy Watkins could not give a good reason on why, other than their billing is performed by a 3rd party. They wanted permission to call the patient and try to obtain insurance information.  I discussed that this patient would not have received a phone call informing her of her out of pocket cost, and that I did not have an issue of [facility] calling the patient and obtaining updated information so they could collect from insurance, I did have a problem of the patient having ANY out of pocket cost at this point. I was transferred to a case manager, who will call me back.

## 2014-11-08 ENCOUNTER — Ambulatory Visit (INDEPENDENT_AMBULATORY_CARE_PROVIDER_SITE_OTHER): Payer: BLUE CROSS/BLUE SHIELD | Admitting: Family Medicine

## 2014-11-08 ENCOUNTER — Encounter: Payer: Self-pay | Admitting: Family Medicine

## 2014-11-08 VITALS — BP 108/68 | HR 79 | Temp 98.9°F | Wt 175.0 lb

## 2014-11-08 DIAGNOSIS — I10 Essential (primary) hypertension: Secondary | ICD-10-CM

## 2014-11-08 DIAGNOSIS — Z23 Encounter for immunization: Secondary | ICD-10-CM

## 2014-11-08 DIAGNOSIS — E785 Hyperlipidemia, unspecified: Secondary | ICD-10-CM

## 2014-11-08 LAB — LIPID PANEL
Cholesterol: 164 mg/dL (ref 0–200)
HDL: 53.7 mg/dL (ref >39.00)
LDL CALC: 91 mg/dL (ref 0–99)
NONHDL: 110.3
TRIGLYCERIDES: 95 mg/dL (ref 0.0–149.0)
Total CHOL/HDL Ratio: 3
VLDL: 19 mg/dL (ref 0.0–40.0)

## 2014-11-08 LAB — BASIC METABOLIC PANEL
BUN: 16 mg/dL (ref 6–23)
CO2: 28 meq/L (ref 19–32)
Calcium: 9.6 mg/dL (ref 8.4–10.5)
Chloride: 104 mEq/L (ref 96–112)
Creatinine, Ser: 0.78 mg/dL (ref 0.40–1.20)
GFR: 81.56 mL/min (ref >60.00)
Glucose, Bld: 89 mg/dL (ref 70–99)
POTASSIUM: 3.6 meq/L (ref 3.5–5.1)
SODIUM: 138 meq/L (ref 135–145)

## 2014-11-08 LAB — HEPATIC FUNCTION PANEL
ALT: 27 U/L (ref 0–35)
AST: 22 U/L (ref 0–37)
Albumin: 4.2 g/dL (ref 3.5–5.2)
Alkaline Phosphatase: 80 U/L (ref 39–117)
BILIRUBIN TOTAL: 0.5 mg/dL (ref 0.2–1.2)
Bilirubin, Direct: 0.1 mg/dL (ref 0.0–0.3)
Total Protein: 7.4 g/dL (ref 6.0–8.3)

## 2014-11-08 NOTE — Assessment & Plan Note (Signed)
Check labs today con't crestor

## 2014-11-08 NOTE — Patient Instructions (Signed)

## 2014-11-08 NOTE — Progress Notes (Signed)
Pre visit review using our clinic review tool, if applicable. No additional management support is needed unless otherwise documented below in the visit note. 

## 2014-11-08 NOTE — Progress Notes (Signed)
Subjective:    Patient ID: Kathy Watkins, female    DOB: September 04, 1961, 54 y.o.   MRN: [id number]  HPI  Patient here for f/u bp and cholesterol.  Pt had her last breast reconstruction surgery last month and will be able to start exercising soon.     Past Medical History  Diagnosis Date  . Hypertension   . Hyperlipidemia   . Colon polyps   . Atypical ductal hyperplasia of breast   . Anxiety   . Lobular carcinoma in situ     Hx: of 2015  . PONV (postoperative nausea and vomiting)   . Breast cancer     "right only" (10/30/2013)    Review of Systems  Constitutional: Negative for activity change, appetite change, fatigue and unexpected weight change.  Respiratory: Negative for cough and shortness of breath.   Cardiovascular: Negative for chest pain, palpitations and leg swelling.  Psychiatric/Behavioral: Negative for behavioral problems and dysphoric mood. The patient is not nervous/anxious.        Objective:    Physical Exam  Constitutional: She is oriented to person, place, and time. She appears well-developed and well-nourished. No distress.  HENT:  Right Ear: External ear normal.  Left Ear: External ear normal.  Nose: Nose normal.  Mouth/Throat: Oropharynx is clear and moist.  Eyes: EOM are normal. Pupils are equal, round, and reactive to light.  Neck: Normal range of motion. Neck supple.  Cardiovascular: Normal rate, regular rhythm and normal heart sounds.   No murmur heard. Pulmonary/Chest: Effort normal and breath sounds normal. No respiratory distress. She has no wheezes. She has no rales. She exhibits no tenderness.  Neurological: She is alert and oriented to person, place, and time.  Psychiatric: She has a normal mood and affect. Her behavior is normal. Judgment and thought content normal.    BP 108/68 mmHg  Pulse 79  Temp(Src) 98.9 F (37.2 C) (Oral)  Wt 175 lb (79.379 kg)  SpO2 97% Wt Readings from Last 3 Encounters:  11/08/14 175 lb (79.379 kg)  08/30/14  176 lb 12.8 oz (80.196 kg)  06/07/14 179 lb 6.4 oz (81.375 kg)     Lab Results  Component Value Date   WBC 11.7* 11/01/2013   HGB 13.2 11/01/2013   HCT 39.8 11/01/2013   PLT 240 11/01/2013   GLUCOSE 87 03/08/2014   CHOL 182 03/08/2014   TRIG 102.0 03/08/2014   HDL 51.00 03/08/2014   LDLDIRECT 151.0 09/11/2013   LDLCALC 111* 03/08/2014   ALT 27 03/08/2014   AST 20 03/08/2014   NA 138 03/08/2014   K 3.6 03/08/2014   CL 103 03/08/2014   CREATININE 0.8 03/08/2014   BUN 15 03/08/2014   CO2 29 03/08/2014   TSH 1.91 09/11/2013    Dg Lumbar Spine 2-3 Views  08/30/2014   CLINICAL DATA:  Low back pain.  EXAM: LUMBAR SPINE - 2-3 VIEW  COMPARISON:  None.  FINDINGS: Diffuse degenerative changes lumbar spine. Degenerative changes most prominent at L2-L3. No acute abnormality identified. Scoliosis concave left. Normal mineralization. Left nephrolithiasis. Peripheral vascular calcification.  IMPRESSION: 1. Lumbar spine degenerative changes scoliosis. No acute abnormality.  2.  Left nephrolithiasis.  3.  Aortoiliac atherosclerotic vascular disease.   Electronically Signed   By: Marcello Moores  Register   On: 08/30/2014 10:28       Assessment & Plan:   Problem List Items Addressed This Visit    Hyperlipidemia - Primary    Check labs today con't crestor  Relevant Orders   Basic metabolic panel   Hepatic function panel   Lipid panel   Essential hypertension    Cont norvasc D/c hctz-- bp has been running low          Garnet Koyanagi, DO

## 2014-11-08 NOTE — Assessment & Plan Note (Signed)
Cont norvasc D/c hctz-- bp has been running low

## 2014-11-16 ENCOUNTER — Other Ambulatory Visit: Payer: Self-pay | Admitting: Family Medicine

## 2015-01-18 ENCOUNTER — Other Ambulatory Visit: Payer: Self-pay | Admitting: Family Medicine

## 2015-02-07 ENCOUNTER — Other Ambulatory Visit: Payer: Self-pay

## 2015-02-07 DIAGNOSIS — R590 Localized enlarged lymph nodes: Secondary | ICD-10-CM

## 2015-02-07 NOTE — Addendum Note (Signed)
Addended by: Luella Cook III on: 02/07/2015 05:06 PM   Modules accepted: Orders

## 2015-02-12 ENCOUNTER — Other Ambulatory Visit: Payer: Self-pay

## 2015-02-12 DIAGNOSIS — R590 Localized enlarged lymph nodes: Secondary | ICD-10-CM

## 2015-02-15 ENCOUNTER — Ambulatory Visit
Admission: RE | Admit: 2015-02-15 | Discharge: 2015-02-15 | Disposition: A | Discharge status: Home / Self Care | Payer: BLUE CROSS/BLUE SHIELD | Source: Ambulatory Visit | Attending: General Surgery | Admitting: General Surgery

## 2015-03-14 ENCOUNTER — Encounter: Payer: Self-pay | Admitting: Family Medicine

## 2015-03-14 MED ORDER — ROSUVASTATIN CALCIUM 10 MG PO TABS: ORAL_TABLET | ORAL | Status: DC

## 2015-03-27 DIAGNOSIS — M419 Scoliosis, unspecified: Secondary | ICD-10-CM | POA: Insufficient documentation

## 2015-03-29 ENCOUNTER — Ambulatory Visit (INDEPENDENT_AMBULATORY_CARE_PROVIDER_SITE_OTHER): Payer: 59 | Admitting: Family Medicine

## 2015-03-29 ENCOUNTER — Encounter: Payer: Self-pay | Admitting: Family Medicine

## 2015-03-29 VITALS — BP 116/78 | HR 87 | Temp 98.3°F | Ht 65.0 in | Wt 172.0 lb

## 2015-03-29 DIAGNOSIS — M62838 Other muscle spasm: Secondary | ICD-10-CM | POA: Diagnosis not present

## 2015-03-29 MED ORDER — CYCLOBENZAPRINE HCL 10 MG PO TABS: 10.0000 mg | ORAL_TABLET | Freq: Three times a day (TID) | ORAL | Status: DC | PRN

## 2015-03-29 NOTE — Patient Instructions (Signed)
Thoracic Strain You have injured the muscles or tendons that attach to the upper part of your back behind your chest. This injury is called a thoracic strain, thoracic sprain, or mid-back strain.  CAUSES  The cause of thoracic strain varies. A less severe injury involves pulling a muscle or tendon without tearing it. A more severe injury involves tearing (rupturing) a muscle or tendon. With less severe injuries, there may be little loss of strength. Sometimes, there are breaks (fractures) in the bones to which the muscles are attached. These fractures are rare, unless there was a direct hit (trauma) or you have weak bones due to osteoporosis or age. Longstanding strains may be caused by overuse or improper form during certain movements. Obesity can also increase your risk for back injuries. Sudden strains may occur due to injury or not warming up properly before exercise. Often, there is no obvious cause for a thoracic strain. SYMPTOMS  The main symptom is pain, especially with movement, such as during exercise. DIAGNOSIS  Your caregiver can usually tell what is wrong by taking an X-ray and doing a physical exam. TREATMENT   Physical therapy may be helpful for recovery. Your caregiver can give you exercises to do or refer you to a physical therapist after your pain improves.  After your pain improves, strengthening and conditioning programs appropriate for your sport or occupation may be helpful.  Always warm up before physical activities or athletics. Stretching after physical activity may also help.  Certain over-the-counter medicines may also help. Ask your caregiver if there are medicines that would help you. If this is your first thoracic strain injury, proper care and proper healing time before starting activities should prevent long-term problems. Torn ligaments and tendons require as long to heal as broken bones. Average healing times may be only 1 week for a mild strain. For torn muscles  and tendons, healing time may be up to 6 weeks to 2 months. HOME CARE INSTRUCTIONS   Apply ice to the injured area. Ice massages may also be used as directed.  Put ice in a plastic bag.  Place a towel between your skin and the bag.  Leave the ice on for 15-20 minutes, 03-04 times a day, for the first 2 days.  Only take over-the-counter or prescription medicines for pain, discomfort, or fever as directed by your caregiver.  Keep your appointments for physical therapy if this was prescribed.  Use wraps and back braces as instructed. SEEK IMMEDIATE MEDICAL CARE IF:   You have an increase in bruising, swelling, or pain.  Your pain has not improved with medicines.  You develop new shortness of breath, chest pain, or fever.  Problems seem to be getting worse rather than better. MAKE SURE YOU:   Understand these instructions.  Will watch your condition.  Will get help right away if you are not doing well or get worse. Document Released: 12/05/2003 Document Revised: 12/07/2011 Document Reviewed: 10/31/2010 [facility] Patient Information 2015 [place], [place]. This information is not intended to replace advice given to you by your health care provider. Make sure you discuss any questions you have with your health care provider.

## 2015-03-29 NOTE — Progress Notes (Signed)
Pre visit review using our clinic review tool, if applicable. No additional management support is needed unless otherwise documented below in the visit note. 

## 2015-03-29 NOTE — Progress Notes (Signed)
Subjective:    Patient ID: Kathy Watkins, female    DOB: 07/25/1961, 54 y.o.   MRN: [id number]  HPI  Patient here c/o pain around R scapula since yesterday.    Past Medical History  Diagnosis Date  . Hypertension   . Hyperlipidemia   . Colon polyps   . Atypical ductal hyperplasia of breast   . Anxiety   . Lobular carcinoma in situ     Hx: of 2015  . PONV (postoperative nausea and vomiting)   . Breast cancer     "right only" (10/30/2013)    Review of Systems  Constitutional: Negative for diaphoresis, appetite change, fatigue and unexpected weight change.  Eyes: Negative for pain, redness and visual disturbance.  Respiratory: Negative for cough, chest tightness, shortness of breath and wheezing.   Cardiovascular: Negative for chest pain, palpitations and leg swelling.  Endocrine: Negative for cold intolerance, heat intolerance, polydipsia, polyphagia and polyuria.  Genitourinary: Negative for dysuria, frequency and difficulty urinating.  Musculoskeletal: Positive for back pain.  Neurological: Negative for dizziness, light-headedness, numbness and headaches.    Current Outpatient Prescriptions on File Prior to Visit  Medication Sig Dispense Refill  . ALPRAZolam (XANAX) 0.25 MG tablet Take 0.25 mg by mouth 3 (three) times daily as needed for anxiety.    Marland Kitchen amLODipine (NORVASC) 10 MG tablet TAKE 1 TABLET (10 MG TOTAL) BY MOUTH DAILY. 90 tablet 1  . methocarbamol (ROBAXIN) 500 MG tablet Take 1 tablet (500 mg total) by mouth every 6 (six) hours as needed for muscle spasms. 45 tablet 0  . rosuvastatin (CRESTOR) 10 MG tablet TAKE 1 TABLET (10 MG TOTAL) BY MOUTH DAILY. 90 tablet 1   No current facility-administered medications on file prior to visit.       Objective:    Physical Exam  Constitutional: She is oriented to person, place, and time. She appears well-developed and well-nourished.  HENT:  Head: Normocephalic and atraumatic.  Eyes: Conjunctivae and EOM are normal.    Neck: Normal range of motion. Neck supple. No JVD present. Carotid bruit is not present. No thyromegaly present.  Cardiovascular: Normal rate, regular rhythm and normal heart sounds.   No murmur heard. Pulmonary/Chest: Effort normal and breath sounds normal. No respiratory distress. She has no wheezes. She has no rales. She exhibits no tenderness.  Musculoskeletal: She exhibits tenderness. She exhibits no edema.       Arms: Neurological: She is alert and oriented to person, place, and time.  Psychiatric: She has a normal mood and affect.    BP 116/78 mmHg  Pulse 87  Temp(Src) 98.3 F (36.8 C) (Oral)  Ht 5\' 5"  (1.651 m)  Wt 172 lb (78.019 kg)  BMI 28.62 kg/m2  SpO2 99% Wt Readings from Last 3 Encounters:  03/29/15 172 lb (78.019 kg)  11/08/14 175 lb (79.379 kg)  08/30/14 176 lb 12.8 oz (80.196 kg)     Lab Results  Component Value Date   WBC 11.7* 11/01/2013   HGB 13.2 11/01/2013   HCT 39.8 11/01/2013   PLT 240 11/01/2013   GLUCOSE 89 11/08/2014   CHOL 164 11/08/2014   TRIG 95.0 11/08/2014   HDL 53.70 11/08/2014   LDLDIRECT 151.0 09/11/2013   LDLCALC 91 11/08/2014   ALT 27 11/08/2014   AST 22 11/08/2014   NA 138 11/08/2014   K 3.6 11/08/2014   CL 104 11/08/2014   CREATININE 0.78 11/08/2014   BUN 16 11/08/2014   CO2 28 11/08/2014   TSH 1.91 09/11/2013  Assessment & Plan:   Problem List Items Addressed This Visit    None    Visit Diagnoses    Muscle spasm    -  Primary    Relevant Medications    cyclobenzaprine (FLEXERIL) 10 MG tablet     pt shown stretches to do at home.  I am having Kathy Watkins start on cyclobenzaprine. I am also having her maintain her ALPRAZolam, methocarbamol, amLODipine, and rosuvastatin.  Meds ordered this encounter  Medications  . cyclobenzaprine (FLEXERIL) 10 MG tablet    Sig: Take 1 tablet (10 mg total) by mouth 3 (three) times daily as needed for muscle spasms.    Dispense:  30 tablet    Refill:  0     Garnet Koyanagi,  DO Subjective:    Kathy Watkins is a 54 y.o. female who presents for evaluation of mid back pain. The patient has had no prior back problems. Symptoms have been present for 2 days and are gradually worsening.  Onset was related to / precipitated by no known injury. The pain is located in the  and radiates to the right arm. The pain is described as aching and tingling and occurs all day. She rates her pain as severe. Symptoms are exacerbated by nothing in particular. Symptoms are improved by change in body position. She has also tried heat, ice, muscle relaxants, NSAIDs and rest which provided no symptom relief. She has tingling /numbness in r arm associated with the back pain. The patient has no "red flag" history indicative of complicated back pain.  The following portions of the patient's history were reviewed and updated as appropriate: allergies, current medications, past family history, past medical history, past social history, past surgical history and problem list.  Review of Systems Pertinent items are noted in HPI.    Objective:   Inspection and palpation: inspection of back is normal. Muscle tone and ROM exam: muscle spasm noted R trapezius. Neurological: normal DTRs, muscle strength and reflexes.    Assessment:    upper back pain    Plan:    Proper lifting, bending technique discussed. Stretching exercises discussed. Short (2-4 day) period of relative rest recommended until acute symptoms improve. Ice to affected area as needed for local pain relief. Heat to affected area as needed for local pain relief. Muscle relaxants per medication orders. Follow-up in 2 weeks. --- if needed

## 2015-04-12 ENCOUNTER — Other Ambulatory Visit: Payer: Self-pay | Admitting: Family Medicine

## 2015-04-12 ENCOUNTER — Encounter: Payer: Self-pay | Admitting: Family Medicine

## 2015-04-12 DIAGNOSIS — M545 Low back pain, unspecified: Secondary | ICD-10-CM

## 2015-04-12 DIAGNOSIS — M546 Pain in thoracic spine: Secondary | ICD-10-CM

## 2015-04-12 NOTE — Telephone Encounter (Signed)
We can get her into ortho and rx stronger pain meds if she would like

## 2015-04-12 NOTE — Telephone Encounter (Signed)
What are you trying to give her?

## 2015-04-15 ENCOUNTER — Other Ambulatory Visit: Payer: Self-pay | Admitting: Family Medicine

## 2015-04-15 ENCOUNTER — Ambulatory Visit (HOSPITAL_BASED_OUTPATIENT_CLINIC_OR_DEPARTMENT_OTHER)
Admission: RE | Admit: 2015-04-15 | Discharge: 2015-04-15 | Disposition: A | Discharge status: Home / Self Care | Payer: 59 | Source: Ambulatory Visit | Attending: Family Medicine | Admitting: Family Medicine

## 2015-04-15 ENCOUNTER — Encounter: Payer: Self-pay | Admitting: Family Medicine

## 2015-04-15 DIAGNOSIS — M5136 Other intervertebral disc degeneration, lumbar region: Secondary | ICD-10-CM | POA: Insufficient documentation

## 2015-04-15 DIAGNOSIS — M546 Pain in thoracic spine: Secondary | ICD-10-CM

## 2015-04-15 DIAGNOSIS — M545 Low back pain, unspecified: Secondary | ICD-10-CM

## 2015-04-15 DIAGNOSIS — N200 Calculus of kidney: Secondary | ICD-10-CM | POA: Insufficient documentation

## 2015-04-15 DIAGNOSIS — M419 Scoliosis, unspecified: Secondary | ICD-10-CM | POA: Diagnosis not present

## 2015-04-15 NOTE — Addendum Note (Signed)
Addended by: Rosalita Chessman on: 04/15/2015 04:56 PM   Modules accepted: Orders

## 2015-05-21 ENCOUNTER — Encounter: Payer: Self-pay | Admitting: Family Medicine

## 2015-05-23 ENCOUNTER — Ambulatory Visit (INDEPENDENT_AMBULATORY_CARE_PROVIDER_SITE_OTHER): Payer: 59 | Admitting: Family Medicine

## 2015-05-23 ENCOUNTER — Encounter: Payer: Self-pay | Admitting: Family Medicine

## 2015-05-23 VITALS — BP 130/88 | HR 81 | Temp 98.2°F | Ht 65.0 in | Wt 171.4 lb

## 2015-05-23 DIAGNOSIS — Z01810 Encounter for preprocedural cardiovascular examination: Secondary | ICD-10-CM

## 2015-05-23 DIAGNOSIS — E785 Hyperlipidemia, unspecified: Secondary | ICD-10-CM | POA: Diagnosis not present

## 2015-05-23 DIAGNOSIS — I10 Essential (primary) hypertension: Secondary | ICD-10-CM

## 2015-05-23 NOTE — Progress Notes (Signed)
Subjective:    Kathy Watkins is a 54 y.o. female who presents to the office today for a preoperative consultation at the request of surgeon . who plans on performing surgery on c spine in September 2016.   This consultation is requested for the specific conditions prompting preoperative evaluation (i.e. because of potential affect on operative risk): age. Planned anesthesia: none. The patient has the following known anesthesia issues: none. Patients bleeding risk: no recent abnormal bleeding. The following portions of the patient's history were reviewed and updated as appropriate:  She  has a past medical history of Hypertension; Hyperlipidemia; Colon polyps; Atypical ductal hyperplasia of breast; Anxiety; Lobular carcinoma in situ; PONV (postoperative nausea and vomiting); and Breast cancer. She  does not have any pertinent problems on file. She  has past surgical history that includes Abdominal hysterectomy (1994); Parotid gland tumor excision (2000); Colonoscopy w/ biopsies and polypectomy; Dilation and curettage of uterus; Breast reconstruction with placement of tissue expander and flex hd (acellular hydrated dermis) (Bilateral, 10/30/2013); Mastectomy (Bilateral, 10/30/2013); Tubal ligation (1989); Breast lumpectomy (Left, 2008); Breast biopsy (Left, 2008); Breast biopsy (Right); Mastectomy, modified radical w/reconstruction (Bilateral, 10/30/2013); and Breast reconstruction with placement of tissue expander and flex hd (acellular hydrated dermis) (Bilateral, 10/30/2013). Her family history includes Breast cancer (age of onset: 43) in her mother; Colon cancer (age of onset: 4) in her paternal grandmother; Colon polyps in her sister; Heart attack in her maternal uncle; Hyperlipidemia in her father and sister; Hypertension in her father, mother, and sister; Kidney disease in her father; Lung cancer in her maternal grandmother; Melanoma (age of onset: 63) in her paternal aunt; Prostate cancer (age of onset:  76) in her father; Stroke (age of onset: 1) in her paternal grandfather. She  reports that she has never smoked. She has never used smokeless tobacco. She reports that she drinks about 1.2 oz of alcohol per week. She reports that she does not use illicit drugs. She has a current medication list which includes the following prescription(s): alprazolam, amlodipine, morphine, and rosuvastatin. Current Outpatient Prescriptions on File Prior to Visit  Medication Sig Dispense Refill  . ALPRAZolam (XANAX) 0.25 MG tablet Take 0.25 mg by mouth 3 (three) times daily as needed for anxiety.    Marland Kitchen amLODipine (NORVASC) 10 MG tablet TAKE 1 TABLET (10 MG TOTAL) BY MOUTH DAILY. 90 tablet 1  . rosuvastatin (CRESTOR) 10 MG tablet TAKE 1 TABLET (10 MG TOTAL) BY MOUTH DAILY. 90 tablet 1   No current facility-administered medications on file prior to visit.   She has No Known Allergies..  Review of Systems Review of Systems  Constitutional: Negative for activity change, appetite change and fatigue.  HENT: Negative for hearing loss, congestion, tinnitus and ear discharge.  dentist q3m Eyes: Negative for visual disturbance (see optho q1y -- vision corrected to 20/20 with glasses).  Respiratory: Negative for cough, chest tightness and shortness of breath.   Cardiovascular: Negative for chest pain, palpitations and leg swelling.  Gastrointestinal: Negative for abdominal pain, diarrhea, constipation and abdominal distention.  Genitourinary: Negative for urgency, frequency, decreased urine volume and difficulty urinating.  Musculoskeletal: Negative for back pain, arthralgias and gait problem.  Skin: Negative for color change, pallor and rash.  Neurological: Negative for dizziness, light-headedness, numbness and headaches.  Hematological: Negative for adenopathy. Does not bruise/bleed easily.  Psychiatric/Behavioral: Negative for suicidal ideas, confusion, sleep disturbance, self-injury, dysphoric mood, decreased  concentration and agitation.        Objective:    BP 130/88  mmHg  Pulse 81  Temp(Src) 98.2 F (36.8 C) (Oral)  Ht 5\' 5"  (1.651 m)  Wt 171 lb 6.4 oz (77.747 kg)  BMI 28.52 kg/m2  SpO2 97% General appearance: alert, cooperative, appears stated age and no distress Head: Normocephalic, without obvious abnormality, atraumatic Eyes: conjunctivae/corneas clear. PERRL, EOM&#39;s intact. Fundi benign. Ears: normal TM's and external ear canals both ears Nose: Nares normal. Septum midline. Mucosa normal. No drainage or sinus tenderness. Throat: lips, mucosa, and tongue normal; teeth and gums normal Neck: no adenopathy, no carotid bruit, no JVD, supple, symmetrical, trachea midline and thyroid not enlarged, symmetric, no tenderness/mass/nodules Back: symmetric, no curvature. ROM normal. No CVA tenderness. Lungs: clear to auscultation bilaterally Heart: regular rate and rhythm, S1, S2 normal, no murmur, click, rub or gallop Abdomen: soft, non-tender; bowel sounds normal; no masses,  no organomegaly Extremities: extremities normal, atraumatic, no cyanosis or edema Pulses: 2+ and symmetric Skin: Skin color, texture, turgor normal. No rashes or lesions   Dentition: No chipped, loose, or missing teeth.  Cardiographics ECG: normal sinus rhythm, no blocks or conduction defects, no ischemic changes Echocardiogram: not done  Imaging Chest x-ray: not done   Lab Review  No visits with results within 6 Month(s) from this visit. Latest known visit with results is:  Office Visit on 11/08/2014  Component Date Value  . Sodium 11/08/2014 138   . Potassium 11/08/2014 3.6   . Chloride 11/08/2014 104   . CO2 11/08/2014 28   . Glucose, Bld 11/08/2014 89   . BUN 11/08/2014 16   . Creatinine, Ser 11/08/2014 0.78   . Calcium 11/08/2014 9.6   . GFR 11/08/2014 81.56   . Total Bilirubin 11/08/2014 0.5   . Bilirubin, Direct 11/08/2014 0.1   . Alkaline Phosphatase 11/08/2014 80   . AST 11/08/2014 22    . ALT 11/08/2014 27   . Total Protein 11/08/2014 7.4   . Albumin 11/08/2014 4.2   . Cholesterol 11/08/2014 164   . Triglycerides 11/08/2014 95.0   . HDL 11/08/2014 53.70   . VLDL 11/08/2014 19.0   . LDL Cholesterol 11/08/2014 91   . Total CHOL/HDL Ratio 11/08/2014 3   . NonHDL 11/08/2014 110.30       Assessment:      54 y.o. female with planned surgery as above.   Known risk factors for perioperative complications: None   Difficulty with intubation is not anticipated.  Cardiac Risk Estimation: low       Plan:    1. Preoperative workup as follows ECG, hemoglobin, hematocrit, electrolytes, creatinine. 2. Change in medication regimen before surgery: per surgical team. 3. Prophylaxis for cardiac events with perioperative beta-blockers: not indicated. 4. Invasive hemodynamic monitoring perioperatively: not indicated. 5. Deep vein thrombosis prophylaxis postoperatively:regimen to be chosen by surgical team. 6. Surveillance for postoperative MI with ECG immediately postoperatively and on postoperative days 1 and 2 AND troponin levels 24 hours postoperatively and on day 4 or hospital discharge (whichever comes first): at the discretion of anesthesiologist. 7. Other measures: consult triad hospitalist if needed

## 2015-05-23 NOTE — Patient Instructions (Signed)
Magnesium Citrate drink one bottle for initial clean and then try daily Colace to help with constipation.  Constipation Constipation is when a person has fewer than three bowel movements a week, has difficulty having a bowel movement, or has stools that are dry, hard, or larger than normal. As people grow older, constipation is more common. If you try to fix constipation with medicines that make you have a bowel movement (laxatives), the problem may get worse. Long-term laxative use may cause the muscles of the colon to become weak. A low-fiber diet, not taking in enough fluids, and taking certain medicines may make constipation worse.  CAUSES   Certain medicines, such as antidepressants, pain medicine, iron supplements, antacids, and water pills.   Certain diseases, such as diabetes, irritable bowel syndrome (IBS), thyroid disease, or depression.   Not drinking enough water.   Not eating enough fiber-rich foods.   Stress or travel.   Lack of physical activity or exercise.   Ignoring the urge to have a bowel movement.   Using laxatives too much.  SIGNS AND SYMPTOMS   Having fewer than three bowel movements a week.   Straining to have a bowel movement.   Having stools that are hard, dry, or larger than normal.   Feeling full or bloated.   Pain in the lower abdomen.   Not feeling relief after having a bowel movement.  DIAGNOSIS  Your health care provider will take a medical history and perform a physical exam. Further testing may be done for severe constipation. Some tests may include:  A barium enema X-ray to examine your rectum, colon, and, sometimes, your small intestine.   A sigmoidoscopy to examine your lower colon.   A colonoscopy to examine your entire colon. TREATMENT  Treatment will depend on the severity of your constipation and what is causing it. Some dietary treatments include drinking more fluids and eating more fiber-rich foods. Lifestyle  treatments may include regular exercise. If these diet and lifestyle recommendations do not help, your health care provider may recommend taking over-the-counter laxative medicines to help you have bowel movements. Prescription medicines may be prescribed if over-the-counter medicines do not work.  HOME CARE INSTRUCTIONS   Eat foods that have a lot of fiber, such as fruits, vegetables, whole grains, and beans.  Limit foods high in fat and processed sugars, such as french fries, hamburgers, cookies, candies, and soda.   A fiber supplement may be added to your diet if you cannot get enough fiber from foods.   Drink enough fluids to keep your urine clear or pale yellow.   Exercise regularly or as directed by your health care provider.   Go to the restroom when you have the urge to go. Do not hold it.   Only take over-the-counter or prescription medicines as directed by your health care provider. Do not take other medicines for constipation without talking to your health care provider first.  [place] IF:   You have bright red blood in your stool.   Your constipation lasts for more than 4 days or gets worse.   You have abdominal or rectal pain.   You have thin, pencil-like stools.   You have unexplained weight loss. MAKE SURE YOU:   Understand these instructions.  Will watch your condition.  Will get help right away if you are not doing well or get worse. Document Released: 06/12/2004 Document Revised: 09/19/2013 Document Reviewed: 06/26/2013 [facility] Patient Information 2015 [place], [place]. This information is not  intended to replace advice given to you by your health care provider. Make sure you discuss any questions you have with your health care provider.

## 2015-05-23 NOTE — Progress Notes (Signed)
Pre visit review using our clinic review tool, if applicable. No additional management support is needed unless otherwise documented below in the visit note. 

## 2015-05-24 LAB — COMPREHENSIVE METABOLIC PANEL
ALK PHOS: 70 U/L (ref 39–117)
ALT: 25 U/L (ref 0–35)
AST: 21 U/L (ref 0–37)
Albumin: 4.3 g/dL (ref 3.5–5.2)
BILIRUBIN TOTAL: 0.3 mg/dL (ref 0.2–1.2)
BUN: 17 mg/dL (ref 6–23)
CO2: 26 mEq/L (ref 19–32)
Calcium: 9.7 mg/dL (ref 8.4–10.5)
Chloride: 103 mEq/L (ref 96–112)
Creatinine, Ser: 0.73 mg/dL (ref 0.40–1.20)
GFR: 87.86 mL/min (ref >60.00)
GLUCOSE: 70 mg/dL (ref 70–99)
Potassium: 4.2 mEq/L (ref 3.5–5.1)
SODIUM: 139 meq/L (ref 135–145)
Total Protein: 7.3 g/dL (ref 6.0–8.3)

## 2015-05-24 LAB — LIPID PANEL
Cholesterol: 156 mg/dL (ref 0–200)
HDL: 53.9 mg/dL (ref >39.00)
LDL Cholesterol: 72 mg/dL (ref 0–99)
NonHDL: 101.88
Total CHOL/HDL Ratio: 3
Triglycerides: 149 mg/dL (ref 0.0–149.0)
VLDL: 29.8 mg/dL (ref 0.0–40.0)

## 2015-05-28 ENCOUNTER — Telehealth: Payer: Self-pay | Admitting: Hematology and Oncology

## 2015-05-28 NOTE — Telephone Encounter (Signed)
Returned patients call as she is having disc surg. And has rescheduled to later in 05/2015

## 2015-05-30 ENCOUNTER — Ambulatory Visit: Payer: BC Managed Care – PPO | Admitting: Hematology and Oncology

## 2015-06-04 ENCOUNTER — Encounter (HOSPITAL_COMMUNITY)
Admission: RE | Admit: 2015-06-04 | Discharge: 2015-06-04 | Disposition: A | Discharge status: Home / Self Care | Payer: 59 | Source: Ambulatory Visit | Attending: Orthopedic Surgery | Admitting: Orthopedic Surgery

## 2015-06-04 ENCOUNTER — Encounter (HOSPITAL_COMMUNITY): Payer: Self-pay

## 2015-06-04 DIAGNOSIS — E780 Pure hypercholesterolemia: Secondary | ICD-10-CM | POA: Diagnosis not present

## 2015-06-04 DIAGNOSIS — Z79899 Other long term (current) drug therapy: Secondary | ICD-10-CM | POA: Diagnosis not present

## 2015-06-04 DIAGNOSIS — M5012 Cervical disc disorder with radiculopathy, mid-cervical region: Secondary | ICD-10-CM | POA: Diagnosis not present

## 2015-06-04 DIAGNOSIS — I10 Essential (primary) hypertension: Secondary | ICD-10-CM | POA: Diagnosis not present

## 2015-06-04 DIAGNOSIS — Z9013 Acquired absence of bilateral breasts and nipples: Secondary | ICD-10-CM | POA: Diagnosis not present

## 2015-06-04 DIAGNOSIS — Z853 Personal history of malignant neoplasm of breast: Secondary | ICD-10-CM | POA: Diagnosis not present

## 2015-06-04 LAB — BASIC METABOLIC PANEL
ANION GAP: 8 (ref 5–15)
BUN: 17 mg/dL (ref 6–20)
CO2: 24 mmol/L (ref 22–32)
Calcium: 9.7 mg/dL (ref 8.9–10.3)
Chloride: 107 mmol/L (ref 101–111)
Creatinine, Ser: 0.72 mg/dL (ref 0.44–1.00)
GLUCOSE: 99 mg/dL (ref 65–99)
POTASSIUM: 3.4 mmol/L — AB (ref 3.5–5.1)
Sodium: 139 mmol/L (ref 135–145)

## 2015-06-04 LAB — CBC
HEMATOCRIT: 44.6 % (ref 36.0–46.0)
HEMOGLOBIN: 14.7 g/dL (ref 12.0–15.0)
MCH: 29.5 pg (ref 26.0–34.0)
MCHC: 33 g/dL (ref 30.0–36.0)
MCV: 89.4 fL (ref 78.0–100.0)
Platelets: 289 10*3/uL (ref 150–400)
RBC: 4.99 MIL/uL (ref 3.87–5.11)
RDW: 12.5 % (ref 11.5–15.5)
WBC: 8.5 10*3/uL (ref 4.0–10.5)

## 2015-06-04 LAB — SURGICAL PCR SCREEN
MRSA, PCR: NEGATIVE
STAPHYLOCOCCUS AUREUS: NEGATIVE

## 2015-06-04 NOTE — Pre-Procedure Instructions (Addendum)
Kathy Watkins  06/04/2015      [id number] [place], [place] [address] [place] [address] Phone: [phone] Fax: [phone]  [facility] ([place]) [place], [place] [address] Phone: [phone] Fax: [phone]  [place], [place] [address] [place] [address] Phone: [phone] Fax: [phone]    Your procedure is scheduled on 06/06/15.  Report to [facility] short stay admitting at 830 A.M.  Call this number if you have problems the morning of surgery:  [phone]   Remember:  Do not eat food or drink liquids after midnight.  Take these medicines the morning of surgery with A SIP OF WATER pain med and xanax if needed, amlodipine(norvasc)  STOP all herbel meds, nsaids (aleve,naproxen,advil,ibuprofen) NOW including vitamins, aspirin   Do not wear jewelry, make-up or nail polish.  Do not wear lotions, powders, or perfumes.  You may wear deodorant.  Do not shave 48 hours prior to surgery.  Men may shave face and neck.  Do not bring valuables to the hospital.  [facility] is not responsible for any belongings or valuables.  Contacts, dentures or bridgework may not be worn into surgery.  Leave your suitcase in the car.  After surgery it may be brought to your room.  For patients admitted to the hospital, discharge time will be determined by your treatment team.  Patients discharged the day of surgery will not be allowed to drive home.   Name and phone number of your driver:    Special instructions:   Special Instructions: [place] - Preparing for Surgery  Before surgery, you can play an important role.  Because skin is not sterile, your skin needs to be as free of germs as possible.  You can reduce the number of germs on you skin by washing with CHG (chlorahexidine gluconate)  soap before surgery.  CHG is an antiseptic cleaner which kills germs and bonds with the skin to continue killing germs even after washing.  Please DO NOT use if you have an allergy to CHG or antibacterial soaps.  If your skin becomes reddened/irritated stop using the CHG and inform your nurse when you arrive at Short Stay.  Do not shave (including legs and underarms) for at least 48 hours prior to the first CHG shower.  You may shave your face.  Please follow these instructions carefully:   1.  Shower with CHG Soap the night before surgery and the morning of Surgery.  2.  If you choose to wash your hair, wash your hair first as usual with your normal shampoo.  3.  After you shampoo, rinse your hair and body thoroughly to remove the Shampoo.  4.  Use CHG as you would any other liquid soap.  You can apply chg directly  to the skin and wash gently with scrungie or a clean washcloth.  5.  Apply the CHG Soap to your body ONLY FROM THE NECK DOWN.  Do not use on open wounds or open sores.  Avoid contact with your eyes ears, mouth and genitals (private parts).  Wash genitals (private parts)       with your normal soap.  6.  Wash thoroughly, paying special attention to the area where your surgery will be performed.  7.  Thoroughly rinse your body with warm water from the  neck down.  8.  DO NOT shower/wash with your normal soap after using and rinsing off the CHG Soap.  9.  Pat yourself dry with a clean towel.            10.  Wear clean pajamas.            11.  Place clean sheets on your bed the night of your first shower and do not sleep with pets.  Day of Surgery  Do not apply any lotions/deodorants the morning of surgery.  Please wear clean clothes to the hospital/surgery center.  Please read over the following fact sheets that you were given. Pain Booklet, Coughing and Deep Breathing, MRSA Information and Surgical Site Infection Prevention

## 2015-06-06 ENCOUNTER — Observation Stay (HOSPITAL_COMMUNITY)
Admission: RE | Admit: 2015-06-06 | Discharge: 2015-06-07 | Disposition: A | Discharge status: Home / Self Care | Payer: 59 | Source: Ambulatory Visit | Attending: Orthopedic Surgery | Admitting: Orthopedic Surgery

## 2015-06-06 ENCOUNTER — Ambulatory Visit (HOSPITAL_COMMUNITY): Payer: 59 | Admitting: Certified Registered Nurse Anesthetist

## 2015-06-06 ENCOUNTER — Encounter (HOSPITAL_COMMUNITY)
Admission: RE | Disposition: A | Discharge status: Home / Self Care | Payer: Self-pay | Source: Ambulatory Visit | Attending: Orthopedic Surgery

## 2015-06-06 ENCOUNTER — Observation Stay (HOSPITAL_COMMUNITY): Payer: 59

## 2015-06-06 ENCOUNTER — Encounter (HOSPITAL_COMMUNITY): Payer: Self-pay | Admitting: *Deleted

## 2015-06-06 ENCOUNTER — Ambulatory Visit (HOSPITAL_COMMUNITY): Payer: 59

## 2015-06-06 DIAGNOSIS — Z853 Personal history of malignant neoplasm of breast: Secondary | ICD-10-CM | POA: Insufficient documentation

## 2015-06-06 DIAGNOSIS — Z419 Encounter for procedure for purposes other than remedying health state, unspecified: Secondary | ICD-10-CM

## 2015-06-06 DIAGNOSIS — Z9013 Acquired absence of bilateral breasts and nipples: Secondary | ICD-10-CM | POA: Insufficient documentation

## 2015-06-06 DIAGNOSIS — I10 Essential (primary) hypertension: Secondary | ICD-10-CM | POA: Insufficient documentation

## 2015-06-06 DIAGNOSIS — M542 Cervicalgia: Secondary | ICD-10-CM | POA: Diagnosis present

## 2015-06-06 DIAGNOSIS — E780 Pure hypercholesterolemia: Secondary | ICD-10-CM | POA: Insufficient documentation

## 2015-06-06 DIAGNOSIS — Z981 Arthrodesis status: Secondary | ICD-10-CM

## 2015-06-06 DIAGNOSIS — M5012 Cervical disc disorder with radiculopathy, mid-cervical region: Principal | ICD-10-CM | POA: Insufficient documentation

## 2015-06-06 DIAGNOSIS — Z79899 Other long term (current) drug therapy: Secondary | ICD-10-CM | POA: Insufficient documentation

## 2015-06-06 HISTORY — PX: CERVICAL DISC ARTHROPLASTY: SHX587

## 2015-06-06 SURGERY — CERVICAL ANTERIOR DISC ARTHROPLASTY
Anesthesia: General | Site: Neck

## 2015-06-06 MED ORDER — OXYCODONE-ACETAMINOPHEN 10-325 MG PO TABS: 1.0000 | ORAL_TABLET | ORAL | Status: DC | PRN

## 2015-06-06 MED ORDER — SUCCINYLCHOLINE CHLORIDE 20 MG/ML IJ SOLN
INTRAMUSCULAR | Status: DC | PRN
  Administered 2015-06-06: 100 mg via INTRAVENOUS

## 2015-06-06 MED ORDER — ONDANSETRON HCL 4 MG/2ML IJ SOLN
INTRAMUSCULAR | Status: AC
  Filled 2015-06-06: qty 2

## 2015-06-06 MED ORDER — LACTATED RINGERS IV SOLN
INTRAVENOUS | Status: DC
  Administered 2015-06-06 (×2): via INTRAVENOUS

## 2015-06-06 MED ORDER — ROCURONIUM BROMIDE 100 MG/10ML IV SOLN
INTRAVENOUS | Status: DC | PRN
  Administered 2015-06-06: 50 mg via INTRAVENOUS

## 2015-06-06 MED ORDER — ROSUVASTATIN CALCIUM 10 MG PO TABS
10.0000 mg | ORAL_TABLET | Freq: Every day | ORAL | Status: DC
  Administered 2015-06-06 – 2015-06-07 (×2): 10 mg via ORAL
  Filled 2015-06-06 (×2): qty 1

## 2015-06-06 MED ORDER — DEXAMETHASONE 4 MG PO TABS: 4.0000 mg | ORAL_TABLET | Freq: Four times a day (QID) | ORAL | Status: AC

## 2015-06-06 MED ORDER — FENTANYL CITRATE (PF) 250 MCG/5ML IJ SOLN
INTRAMUSCULAR | Status: AC
  Filled 2015-06-06: qty 5

## 2015-06-06 MED ORDER — METHOCARBAMOL 500 MG PO TABS
ORAL_TABLET | ORAL | Status: AC
  Filled 2015-06-06: qty 1

## 2015-06-06 MED ORDER — ROCURONIUM BROMIDE 50 MG/5ML IV SOLN
INTRAVENOUS | Status: AC
  Filled 2015-06-06: qty 1

## 2015-06-06 MED ORDER — ACETAMINOPHEN 10 MG/ML IV SOLN
INTRAVENOUS | Status: AC
  Filled 2015-06-06: qty 100

## 2015-06-06 MED ORDER — PROMETHAZINE HCL 25 MG/ML IJ SOLN: 6.2500 mg | INTRAMUSCULAR | Status: DC | PRN

## 2015-06-06 MED ORDER — CEFAZOLIN SODIUM-DEXTROSE 2-3 GM-% IV SOLR
2.0000 g | INTRAVENOUS | Status: AC
  Administered 2015-06-06: 2 g via INTRAVENOUS
  Filled 2015-06-06: qty 50

## 2015-06-06 MED ORDER — SUCCINYLCHOLINE CHLORIDE 20 MG/ML IJ SOLN
INTRAMUSCULAR | Status: AC
  Filled 2015-06-06: qty 1

## 2015-06-06 MED ORDER — HYDROMORPHONE HCL 1 MG/ML IJ SOLN
INTRAMUSCULAR | Status: AC
  Filled 2015-06-06: qty 1

## 2015-06-06 MED ORDER — SODIUM CHLORIDE 0.9 % IV SOLN: 250.0000 mL | INTRAVENOUS | Status: DC

## 2015-06-06 MED ORDER — PHENOL 1.4 % MT LIQD: 1.0000 | OROMUCOSAL | Status: DC | PRN

## 2015-06-06 MED ORDER — MIDAZOLAM HCL 5 MG/5ML IJ SOLN
INTRAMUSCULAR | Status: DC | PRN
  Administered 2015-06-06: 2 mg via INTRAVENOUS

## 2015-06-06 MED ORDER — FENTANYL CITRATE (PF) 100 MCG/2ML IJ SOLN
INTRAMUSCULAR | Status: DC | PRN
  Administered 2015-06-06 (×3): 50 ug via INTRAVENOUS
  Administered 2015-06-06: 100 ug via INTRAVENOUS

## 2015-06-06 MED ORDER — ONDANSETRON HCL 4 MG PO TABS: 4.0000 mg | ORAL_TABLET | Freq: Three times a day (TID) | ORAL | Status: DC | PRN

## 2015-06-06 MED ORDER — MENTHOL 3 MG MT LOZG
1.0000 | LOZENGE | OROMUCOSAL | Status: DC | PRN
  Administered 2015-06-07: 3 mg via ORAL
  Filled 2015-06-06: qty 9

## 2015-06-06 MED ORDER — PHENYLEPHRINE HCL 10 MG/ML IJ SOLN
INTRAMUSCULAR | Status: DC | PRN
  Administered 2015-06-06 (×3): 80 ug via INTRAVENOUS

## 2015-06-06 MED ORDER — NEOSTIGMINE METHYLSULFATE 10 MG/10ML IV SOLN
INTRAVENOUS | Status: AC
  Filled 2015-06-06: qty 1

## 2015-06-06 MED ORDER — PHENYLEPHRINE HCL 10 MG/ML IJ SOLN
INTRAMUSCULAR | Status: AC
  Filled 2015-06-06: qty 1

## 2015-06-06 MED ORDER — LIDOCAINE HCL (CARDIAC) 20 MG/ML IV SOLN
INTRAVENOUS | Status: DC | PRN
  Administered 2015-06-06: 50 mg via INTRAVENOUS

## 2015-06-06 MED ORDER — DEXAMETHASONE SODIUM PHOSPHATE 4 MG/ML IJ SOLN
4.0000 mg | Freq: Four times a day (QID) | INTRAMUSCULAR | Status: AC
  Administered 2015-06-06 – 2015-06-07 (×3): 4 mg via INTRAVENOUS
  Filled 2015-06-06 (×3): qty 1

## 2015-06-06 MED ORDER — THROMBIN 20000 UNITS EX SOLR
OROMUCOSAL | Status: DC | PRN
  Administered 2015-06-06: 12:00:00 via TOPICAL

## 2015-06-06 MED ORDER — LACTATED RINGERS IV SOLN
INTRAVENOUS | Status: DC
  Administered 2015-06-06 (×2): via INTRAVENOUS
  Administered 2015-06-06: 1000 mL via INTRAVENOUS

## 2015-06-06 MED ORDER — OXYCODONE HCL 5 MG PO TABS
ORAL_TABLET | ORAL | Status: AC
  Filled 2015-06-06: qty 2

## 2015-06-06 MED ORDER — METHOCARBAMOL 1000 MG/10ML IJ SOLN
500.0000 mg | Freq: Four times a day (QID) | INTRAVENOUS | Status: DC | PRN
  Filled 2015-06-06: qty 5

## 2015-06-06 MED ORDER — THROMBIN 20000 UNITS EX SOLR
CUTANEOUS | Status: AC
  Filled 2015-06-06: qty 20000

## 2015-06-06 MED ORDER — NEOSTIGMINE METHYLSULFATE 10 MG/10ML IV SOLN
INTRAVENOUS | Status: DC | PRN
  Administered 2015-06-06: 5 mg via INTRAVENOUS

## 2015-06-06 MED ORDER — PHENYLEPHRINE HCL 10 MG/ML IJ SOLN
10.0000 mg | INTRAMUSCULAR | Status: DC | PRN
  Administered 2015-06-06: 10 ug/min via INTRAVENOUS

## 2015-06-06 MED ORDER — ONDANSETRON HCL 4 MG/2ML IJ SOLN
INTRAMUSCULAR | Status: DC | PRN
  Administered 2015-06-06: 4 mg via INTRAVENOUS

## 2015-06-06 MED ORDER — BUPIVACAINE-EPINEPHRINE 0.25% -1:200000 IJ SOLN
INTRAMUSCULAR | Status: DC | PRN
  Administered 2015-06-06: 30 mL

## 2015-06-06 MED ORDER — PROPOFOL 10 MG/ML IV BOLUS
INTRAVENOUS | Status: DC | PRN
  Administered 2015-06-06: 200 mg via INTRAVENOUS

## 2015-06-06 MED ORDER — MIDAZOLAM HCL 2 MG/2ML IJ SOLN
INTRAMUSCULAR | Status: AC
  Filled 2015-06-06: qty 4

## 2015-06-06 MED ORDER — DEXAMETHASONE SODIUM PHOSPHATE 10 MG/ML IJ SOLN
INTRAMUSCULAR | Status: AC
  Filled 2015-06-06: qty 1

## 2015-06-06 MED ORDER — BUPIVACAINE-EPINEPHRINE (PF) 0.25% -1:200000 IJ SOLN
INTRAMUSCULAR | Status: AC
Start: 2015-06-06 — End: 2015-06-06
  Filled 2015-06-06: qty 30

## 2015-06-06 MED ORDER — METHOCARBAMOL 500 MG PO TABS
500.0000 mg | ORAL_TABLET | Freq: Four times a day (QID) | ORAL | Status: DC | PRN
Start: 2015-06-06 — End: 2015-06-07

## 2015-06-06 MED ORDER — ONDANSETRON HCL 4 MG/2ML IJ SOLN
4.0000 mg | INTRAMUSCULAR | Status: DC | PRN
  Administered 2015-06-06: 4 mg via INTRAVENOUS
  Filled 2015-06-06: qty 2

## 2015-06-06 MED ORDER — ACETAMINOPHEN 10 MG/ML IV SOLN
1000.0000 mg | Freq: Four times a day (QID) | INTRAVENOUS | Status: DC
  Administered 2015-06-06 – 2015-06-07 (×3): 1000 mg via INTRAVENOUS
  Filled 2015-06-06 (×4): qty 100

## 2015-06-06 MED ORDER — ALBUMIN HUMAN 5 % IV SOLN
INTRAVENOUS | Status: DC | PRN
  Administered 2015-06-06: 12:00:00 via INTRAVENOUS

## 2015-06-06 MED ORDER — ACETAMINOPHEN 10 MG/ML IV SOLN
1000.0000 mg | Freq: Four times a day (QID) | INTRAVENOUS | Status: DC
  Administered 2015-06-06: 1000 mg via INTRAVENOUS
  Filled 2015-06-06 (×4): qty 100

## 2015-06-06 MED ORDER — GLYCOPYRROLATE 0.2 MG/ML IJ SOLN
INTRAMUSCULAR | Status: DC | PRN
  Administered 2015-06-06: 0.6 mg via INTRAVENOUS

## 2015-06-06 MED ORDER — SODIUM CHLORIDE 0.9 % IJ SOLN
3.0000 mL | Freq: Two times a day (BID) | INTRAMUSCULAR | Status: DC
  Administered 2015-06-06: 3 mL via INTRAVENOUS

## 2015-06-06 MED ORDER — MORPHINE SULFATE (PF) 2 MG/ML IV SOLN
1.0000 mg | INTRAVENOUS | Status: DC | PRN
  Administered 2015-06-07 (×2): 2 mg via INTRAVENOUS
  Filled 2015-06-06 (×2): qty 1

## 2015-06-06 MED ORDER — PROPOFOL 10 MG/ML IV BOLUS
INTRAVENOUS | Status: AC
  Filled 2015-06-06: qty 20

## 2015-06-06 MED ORDER — OXYCODONE HCL 5 MG PO TABS
10.0000 mg | ORAL_TABLET | ORAL | Status: DC | PRN
  Administered 2015-06-06 – 2015-06-07 (×3): 10 mg via ORAL
  Filled 2015-06-06 (×2): qty 2

## 2015-06-06 MED ORDER — DEXAMETHASONE SODIUM PHOSPHATE 10 MG/ML IJ SOLN
INTRAMUSCULAR | Status: DC | PRN
  Administered 2015-06-06: 10 mg via INTRAVENOUS

## 2015-06-06 MED ORDER — CEFAZOLIN SODIUM 1-5 GM-% IV SOLN
1.0000 g | Freq: Three times a day (TID) | INTRAVENOUS | Status: AC
  Administered 2015-06-06 – 2015-06-07 (×2): 1 g via INTRAVENOUS
  Filled 2015-06-06 (×2): qty 50

## 2015-06-06 MED ORDER — SODIUM CHLORIDE 0.9 % IJ SOLN: 3.0000 mL | INTRAMUSCULAR | Status: DC | PRN

## 2015-06-06 MED ORDER — HYDROMORPHONE HCL 1 MG/ML IJ SOLN
0.2500 mg | INTRAMUSCULAR | Status: DC | PRN
  Administered 2015-06-06: 1 mg via INTRAVENOUS

## 2015-06-06 MED ORDER — AMLODIPINE BESYLATE 10 MG PO TABS
10.0000 mg | ORAL_TABLET | Freq: Every day | ORAL | Status: DC
  Administered 2015-06-07: 10 mg via ORAL
  Filled 2015-06-06: qty 1

## 2015-06-06 MED ORDER — 0.9 % SODIUM CHLORIDE (POUR BTL) OPTIME
TOPICAL | Status: DC | PRN
  Administered 2015-06-06: 1000 mL

## 2015-06-06 MED ORDER — GLYCOPYRROLATE 0.2 MG/ML IJ SOLN
INTRAMUSCULAR | Status: AC
  Filled 2015-06-06: qty 3

## 2015-06-06 SURGICAL SUPPLY — 61 items
BIT MILLING PRODISC 2.0 STER (BIT) ×3 IMPLANT
BLADE SURG ROTATE 9660 (MISCELLANEOUS) IMPLANT
BUR EGG ELITE 4.0 (BURR) IMPLANT
BUR EGG ELITE 4.0MM (BURR)
BUR MATCHSTICK NEURO 3.0 LAGG (BURR) IMPLANT
CANISTER SUCTION 2500CC (MISCELLANEOUS) ×3 IMPLANT
CLOSURE STERI-STRIP 1/2X4 (GAUZE/BANDAGES/DRESSINGS) ×1
CLOSURE WOUND 1/2 X4 (GAUZE/BANDAGES/DRESSINGS) ×1
CLSR STERI-STRIP ANTIMIC 1/2X4 (GAUZE/BANDAGES/DRESSINGS) ×2 IMPLANT
COLLAR CERV LO CONTOUR FIRM DE (SOFTGOODS) ×3 IMPLANT
CORDS BIPOLAR (ELECTRODE) ×3 IMPLANT
COVER MAYO STAND STRL (DRAPES) ×6 IMPLANT
COVER SURGICAL LIGHT HANDLE (MISCELLANEOUS) ×6 IMPLANT
CRADLE DONUT ADULT HEAD (MISCELLANEOUS) ×3 IMPLANT
DISC PRODISC-C LRG 5MM (Neuro Prosthesis/Implant) ×3 IMPLANT
DRAPE C-ARM 42X72 X-RAY (DRAPES) ×3 IMPLANT
DRAPE POUCH INSTRU U-SHP 10X18 (DRAPES) ×3 IMPLANT
DRAPE SURG 17X23 STRL (DRAPES) ×3 IMPLANT
DRAPE U-SHAPE 47X51 STRL (DRAPES) ×3 IMPLANT
DRSG MEPILEX BORDER 4X4 (GAUZE/BANDAGES/DRESSINGS) ×3 IMPLANT
DURAPREP 26ML APPLICATOR (WOUND CARE) ×3 IMPLANT
ELECT COATED BLADE 2.86 ST (ELECTRODE) ×3 IMPLANT
ELECT PENCIL ROCKER SW 15FT (MISCELLANEOUS) ×3 IMPLANT
ELECT REM PT RETURN 9FT ADLT (ELECTROSURGICAL) ×3
ELECTRODE REM PT RTRN 9FT ADLT (ELECTROSURGICAL) ×1 IMPLANT
GLOVE BIOGEL PI IND STRL 8 (GLOVE) ×1 IMPLANT
GLOVE BIOGEL PI IND STRL 8.5 (GLOVE) ×1 IMPLANT
GLOVE BIOGEL PI INDICATOR 8 (GLOVE) ×2
GLOVE BIOGEL PI INDICATOR 8.5 (GLOVE) ×2
GLOVE SS BIOGEL STRL SZ 8.5 (GLOVE) ×1 IMPLANT
GLOVE SUPERSENSE BIOGEL SZ 8.5 (GLOVE) ×2
GOWN STRL REUS W/ TWL LRG LVL3 (GOWN DISPOSABLE) ×1 IMPLANT
GOWN STRL REUS W/TWL 2XL LVL3 (GOWN DISPOSABLE) ×6 IMPLANT
GOWN STRL REUS W/TWL LRG LVL3 (GOWN DISPOSABLE) ×2
IMPL INSERTER TIP LRG 5MM (Insert) ×1 IMPLANT
IMPLANT INSERTER TIP LRG 5MM (Insert) ×3 IMPLANT
KIT BASIN OR (CUSTOM PROCEDURE TRAY) ×3 IMPLANT
KIT ROOM TURNOVER OR (KITS) ×3 IMPLANT
NEEDLE SPNL 18GX3.5 QUINCKE PK (NEEDLE) ×3 IMPLANT
NS IRRIG 1000ML POUR BTL (IV SOLUTION) ×3 IMPLANT
PACK ORTHO CERVICAL (CUSTOM PROCEDURE TRAY) ×3 IMPLANT
PACK UNIVERSAL I (CUSTOM PROCEDURE TRAY) ×3 IMPLANT
PAD ARMBOARD 7.5X6 YLW CONV (MISCELLANEOUS) ×6 IMPLANT
PIN RETAINER PRODISC 14 MM (PIN) ×6 IMPLANT
RESTRAINT LIMB HOLDER UNIV (RESTRAINTS) ×3 IMPLANT
SPONGE INTESTINAL PEANUT (DISPOSABLE) IMPLANT
SPONGE SURGIFOAM ABS GEL 100 (HEMOSTASIS) ×3 IMPLANT
STRIP CLOSURE SKIN 1/2X4 (GAUZE/BANDAGES/DRESSINGS) ×2 IMPLANT
SURGIFLO W/THROMBIN 8M KIT (HEMOSTASIS) ×3 IMPLANT
SUT BONE WAX W31G (SUTURE) ×3 IMPLANT
SUT MNCRL AB 3-0 PS2 27 (SUTURE) ×3 IMPLANT
SUT SILK 3 0 TIES 17X18 (SUTURE) ×2
SUT SILK 3-0 18XBRD TIE BLK (SUTURE) ×1 IMPLANT
SUT VIC AB 2-0 CT1 18 (SUTURE) ×3 IMPLANT
SYR BULB IRRIGATION 50ML (SYRINGE) ×3 IMPLANT
SYR CONTROL 10ML LL (SYRINGE) IMPLANT
TAPE CLOTH 4X10 WHT NS (GAUZE/BANDAGES/DRESSINGS) ×3 IMPLANT
TAPE UMBILICAL COTTON 1/8X30 (MISCELLANEOUS) ×3 IMPLANT
TOWEL OR 17X24 6PK STRL BLUE (TOWEL DISPOSABLE) ×3 IMPLANT
TOWEL OR 17X26 10 PK STRL BLUE (TOWEL DISPOSABLE) ×3 IMPLANT
WATER STERILE IRR 1000ML POUR (IV SOLUTION) ×3 IMPLANT

## 2015-06-06 NOTE — Anesthesia Postprocedure Evaluation (Signed)
  Anesthesia Post-op Note  Patient: Kathy Watkins  Procedure(s) Performed: Procedure(s): Cervical 5-Cervical 6 TOTAL DISC ARTHROPLASTY (N/A)  Patient Location: PACU  Anesthesia Type:General  Level of Consciousness: awake  Airway and Oxygen Therapy: Patient Spontanous Breathing  Post-op Pain: mild  Post-op Assessment: Post-op Vital signs reviewed              Post-op Vital Signs: Reviewed  Last Vitals:  Filed Vitals:   06/06/15 1315  BP: 121/69  Pulse:   Temp: 36.6 C  Resp: 14    Complications: No apparent anesthesia complications

## 2015-06-06 NOTE — Brief Op Note (Signed)
06/06/2015  1:16 PM  PATIENT:  Kathy Watkins  54 y.o. 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   PROCEDURE:  Procedure(s): Cervical 5-Cervical 6 TOTAL DISC ARTHROPLASTY (N/A)  SURGEON:  Surgeon(s) and Role:    * Melina Schools, MD - Primary  PHYSICIAN ASSISTANT:   ASSISTANTS: Carmen Mayo   ANESTHESIA:   general  EBL:  Total I/O In: 1250 [I.V.:1000; IV Piggyback:250] Out: 50 [Blood:50]  BLOOD ADMINISTERED:none  DRAINS: none   LOCAL MEDICATIONS USED:  MARCAINE     SPECIMEN:  No Specimen  DISPOSITION OF SPECIMEN:  N/A  COUNTS:  YES  TOURNIQUET:  * No tourniquets in log *  DICTATION: .Other Dictation: Dictation Number [id number]  PLAN OF CARE: Admit for overnight observation  PATIENT DISPOSITION:  PACU - hemodynamically stable.

## 2015-06-06 NOTE — H&P (Signed)
History of Present Illness The patient is a 54 year old female who comes in today for a preoperative History and Physical. The patient is scheduled for a TDR C5-6 to be performed by Dr. Duane Lope D. Rolena Infante, MD at [facility] on 06-06-15 . Please see the hospital record for complete dictated history and physical.  Allergies  No Known Drug Allergies05/30/2013  Family History Hypertension Father, Mother, Sister. mother, father and sister Kidney disease Father. Heart Disease Maternal Grandfather. grandfather mothers side Cancer Father, Maternal Grandfather, Maternal Grandmother, Mother, Paternal Grandmother. mother, grandmother mothers side and grandmother fathers side Cerebrovascular Accident Paternal Grandfather. 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 10-25-14 never smoker Tobacco / smoke exposure no 10-25-14 Children 5 or more  Medication History Morphine Sulfate (15MG  Tablet, 1 (one) Oral three times daily, as needed, Taken starting 04/23/2015) Active. Crestor (5MG  Tablet, Oral) Active. AmLODIPine Besylate (10MG  Tablet, Oral) Active. Xanax (0.25MG  Tablet, Oral) Active. (PRN) Colace (50MG  Capsule, Oral) Active. Medications Reconciled  Past Surgical History  Mastectomy - Bilateral bilateral Hysterectomy partial (non-cancerous) Dilation and  Curettage of Uterus Tubal Ligation Breast Mass; Local Excision left Breast Biopsy multiple times left Colon Polyp Removal - Colonoscopy Breast Reconstruction bilateral  Other Problems  Hypercholesterolemia High blood pressure Breast Cancer  Vitals 05/31/2015 12:40 PM Weight: 180 lb Height: 65in Body Surface Area: 1.89 m Body Mass Index: 29.95 kg/m  Temp.: 98.9F(Oral)  BP: 143/80 (Sitting, Right Arm, Standard)  Physical Exam General General Appearance-Not in acute distress. Orientation-Oriented X3. Build & Nutrition-Well nourished and Well developed.  Integumentary General Characteristics Surgical Scars - no surgical scar evidence of previous cervical surgery. Cervical Spine-Skin examination of the cervical spine is without deformity, skin lesions, lacerations or abrasions.  Chest and Lung Exam Auscultation Breath sounds - Normal and Clear.  Cardiovascular Auscultation Rhythm - Regular rate and rhythm.  Abdomen Palpation/Percussion Palpation and Percussion of the abdomen reveal - Soft and Non Tender.  Peripheral Vascular Upper Extremity Palpation - Radial pulse - Bilateral - 2+.  Neurologic Sensation Upper Extremity - Left - sensation is intact in the upper extremity. Right - sensation is diminished in the upper extremity. Bilateral - sensation is intact in the upper extremity. Reflexes Biceps Reflex - Bilateral - 2+. Brachioradialis Reflex - Bilateral - 2+. Triceps Reflex - Bilateral - 2+. Hoffman's Sign - Bilateral - Hoffman's sign not present.  Musculoskeletal Spine/Ribs/Pelvis  Cervical Spine : Inspection and Palpation - Tenderness - right mid scapular region tender to palpation, right cervical paraspinals tender to palpation and right trapezius tender to palpation, bony/soft tissue palpation of the cervical spine and shoulders does not recreate their typical pain. Strength and Tone: Strength: Strength: Strength - Deltoid -  Bilateral - 5/5. Biceps - Right - 4-/5. Triceps - Bilateral - 5/5. Wrist Extension - Left - 5/5. Right - 4-/5. Hand Grip - Bilateral - 5/5. Heel walk - Bilateral - able to heel walk without difficulty. Toe Walk - Bilateral - able  to walk on toes without difficulty. Heel-Toe Walk - Bilateral - able to heel-toe walk without difficulty. ROM - Flexion - Full. Extension - Mildly decreased and painful. Left Lateral Flexion - Full and painful. Right Lateral Flexion - Full. Left Rotation - Full and painful. Right Rotation - Full. Pain - extension is more painful than flexion. Cervical Spine - Special Testing - axial compression test negative, cross chest impingement test negative. Non-Anatomic Signs - No non-anatomic signs present. Upper Extremity Range of Motion - No truesholder pain with IR/ER of the shoulders.  At this point in time, the patient has biceps weakness, wrist extensor weakness on the right side, sensory deficits on the C6 distribution. She loss of cervical lordosis on her MRI and C5-6 disc herniation posterior lateral to the right. We have gone over her MRI and imaging study, there was a small disc at 6-7 going to the left, but it is a borderline stenosis and she has no left C7 symptoms. At this point in time, I do think that her principal problem is right C6 radiculopathy. She has a positive nerve root tension sign (Spurling sign). She has motor and sensory deficit in the C6 distribution and it has been going on for about eight weeks now.   Assessment & Plan  Goal Of Surgery:Discussed that goal of surgery is to reduce pain and improve function and quality of life. Patient is aware that despite all appropriate treatment that there pain and function could be the same, worse, or different.  Anterior cervical fusion:Risks of surgery include, but are not limited to: Throat pain, swallowing difficulty, hoarseness or change in voice, death, stroke, paralysis, nerve root damage/injury, bleeding, blood  clots, loss of bowel/bladder control, hardware failure, or mal-position, spinal fluid leak, adjacent segment disease, non-union, need for further surgery, ongoing or worse pain, infection. Post-operative bleeding or swelling that could require emergent surgery.  She has seen my partner, Dr. Nelva Bush who did not think injection therapy would be helpful to her. We have had a long discussion about disc replacement surgery and fusion surgery. We have gone over the risks and benefits each including infection, bleeding, nerve damage, death, stroke, paralysis, failure to heal, need for further, ongoing or worse pain, loss of bowel and bladder control, throat pain, swallowing difficulties, hoarseness in the voice, need for supplemental posterior surgery. If we do the disc replacement for technical reasons, we may need to bail out to a fusion. All of their questions were encouraged and addressed.

## 2015-06-06 NOTE — Progress Notes (Signed)
Patient arrived around 1500, alert and oriented able to walk to bed pain under control, will continue to monitor.

## 2015-06-06 NOTE — Anesthesia Procedure Notes (Signed)
Procedure Name: Intubation Date/Time: 06/06/2015 11:17 AM Performed by: Vennie Homans Pre-anesthesia Checklist: Patient identified, Emergency Drugs available, Suction available, Patient being monitored and Timeout performed Patient Re-evaluated:Patient Re-evaluated prior to inductionOxygen Delivery Method: Circle system utilized Preoxygenation: Pre-oxygenation with 100% oxygen Intubation Type: IV induction Ventilation: Mask ventilation without difficulty Laryngoscope Size: Mac and 3 Grade View: Grade II Tube type: Oral Number of attempts: 1 Airway Equipment and Method: Stylet Placement Confirmation: ETT inserted through vocal cords under direct vision,  positive ETCO2 and breath sounds checked- equal and bilateral Secured at: 22 cm Tube secured with: Tape Dental Injury: Teeth and Oropharynx as per pre-operative assessment

## 2015-06-06 NOTE — Transfer of Care (Signed)
Immediate Anesthesia Transfer of Care Note  Patient: Kathy Watkins  Procedure(s) Performed: Procedure(s): Cervical 5-Cervical 6 TOTAL DISC ARTHROPLASTY (N/A)  Patient Location: PACU  Anesthesia Type:General  Level of Consciousness: awake, alert , oriented, patient cooperative and responds to stimulation  Airway & Oxygen Therapy: Patient Spontanous Breathing and Patient connected to nasal cannula oxygen  Post-op Assessment: Report given to RN, Post -op Vital signs reviewed and stable, Patient moving all extremities X 4 and Patient able to stick tongue midline  Post vital signs: Reviewed and stable  Last Vitals:  Filed Vitals:   06/06/15 0839  BP: 154/76  Pulse: 75  Temp: 36.8 C  Resp: 18    Complications: No apparent anesthesia complications

## 2015-06-06 NOTE — Discharge Instructions (Signed)

## 2015-06-06 NOTE — Anesthesia Preprocedure Evaluation (Signed)
Anesthesia Evaluation  Patient identified by MRN, date of birth, ID band Patient awake    Reviewed: Allergy & Precautions, NPO status , Patient's Chart, lab work & pertinent test results  History of Anesthesia Complications (+) PONV  Airway Mallampati: II  TM Distance: >3 FB Neck ROM: Full    Dental   Pulmonary    breath sounds clear to auscultation       Cardiovascular hypertension,  Rhythm:Regular Rate:Normal     Neuro/Psych    GI/Hepatic negative GI ROS,   Endo/Other  negative endocrine ROS  Renal/GU negative Renal ROS     Musculoskeletal   Abdominal   Peds  Hematology negative hematology ROS (+)   Anesthesia Other Findings   Reproductive/Obstetrics                             Anesthesia Physical Anesthesia Plan  ASA: III  Anesthesia Plan: General   Post-op Pain Management:    Induction: Intravenous  Airway Management Planned: Oral ETT  Additional Equipment:   Intra-op Plan:   Post-operative Plan: Extubation in OR  Informed Consent: I have reviewed the patients History and Physical, chart, labs and discussed the procedure including the risks, benefits and alternatives for the proposed anesthesia with the patient or authorized representative who has indicated his/her understanding and acceptance.   Dental advisory given  Plan Discussed with: CRNA and Anesthesiologist  Anesthesia Plan Comments:         Anesthesia Quick Evaluation

## 2015-06-07 ENCOUNTER — Encounter (HOSPITAL_COMMUNITY): Payer: Self-pay | Admitting: Orthopedic Surgery

## 2015-06-07 DIAGNOSIS — M5012 Cervical disc disorder with radiculopathy, mid-cervical region: Secondary | ICD-10-CM | POA: Diagnosis not present

## 2015-06-07 NOTE — Progress Notes (Signed)
PT Cancellation and Discharge Note  Patient Details Name: Kathy Watkins MRN: [id number] DOB: 1961-02-12   Cancelled Treatment:    Reason Eval/Treat Not Completed: PT screened, no needs identified, will sign off  Spoke with occupational therapy after initial evaluation. OT reports patient is functioning at a high level of independence and no physical therapy is indicated at this time. Stopped by and spoke with Mrs. Sparr. Reports she has no questions and is confident with all of her functional abilities after working with Mardene Celeste, OT this AM. PT is signing-off. Please re-order if there is any significant change in status. Thank you for this referral.   Elayne Snare, [place]     Ellouise Newer 06/07/2015, 10:42 AM

## 2015-06-07 NOTE — Evaluation (Signed)
Occupational Therapy Evaluation Patient Details Name: Kathy Watkins MRN: [id number] DOB: 06-14-1961 Today's Date: 06/07/2015    History of Present Illness 54 yo female s/p Cervical 5-Cervical 6 TOTAL DISC ARTHROPLASTY.    Clinical Impression   Patient admitted with above. Patient independent PTA. Patient currently functioning at an overall mod I level. D/C from acute OT services and no additional follow-up OT needs at this time. All appropriate education provided to patient and family (husband). Please re-order OT if needed.    Thoroughly discussed cervical precautions with patient, no cervical bending/arching/twisting. Encouraged pt to cross BLEs for LB ADLs and not reaching down to floor for LB ADLs or to pick something up. Educated pt on LH sponge for LB bathing and reacher to assist with some IADL tasks. Pt and husband both verbalized understanding of this. Discussed importance of talking with surgeon in 2 weeks regarding precautions during follow-up appointment.     Follow Up Recommendations  No OT follow up;Supervision - Intermittent    Equipment Recommendations  None recommended by OT    Recommendations for Other Services  None at this time    Precautions / Restrictions Precautions Precautions: Cervical Precaution Comments: Maintain cervical collar per order. Per pt, she may take collar off for showers. Restrictions Weight Bearing Restrictions: No    Mobility Bed Mobility Overal bed mobility: Modified Independent General bed mobility comments: HOB flat and use of bed rails. Cues for technique/sequencing during bed mobility to adhere to cervical precautions. Pt picks up on education quickly.   Transfers Overall transfer level: Modified independent Equipment used: None General transfer comment: No cues needed, mod I secondary to increased time during OT evaluation.     Balance Overall balance assessment: Modified Independent;No apparent balance deficits (not formally  assessed)    ADL Overall ADL's : Modified independent General ADL Comments: Mod I for ADLs and functional mobility, pt takes increased time due to recent surgery. Pt will have assistance from husband and niece post acute d/c.  Encouraged use of shower seat in shower to sit prn for safety during showers.     Pertinent Vitals/Pain Pain Assessment: No/denies pain ("not bad")     Hand Dominance Right   Extremity/Trunk Assessment Upper Extremity Assessment Upper Extremity Assessment: Overall WFL for tasks assessed   Lower Extremity Assessment Lower Extremity Assessment: Overall WFL for tasks assessed   Cervical / Trunk Assessment Cervical / Trunk Assessment: Other exceptions Cervical / Trunk Exceptions: cervical collar placed   Communication Communication Communication: No difficulties   Cognition Arousal/Alertness: Awake/alert Behavior During Therapy: WFL for tasks assessed/performed Overall Cognitive Status: Within Functional Limits for tasks assessed              Home Living Family/patient expects to be discharged to:: Private residence Living Arrangements: Spouse/significant other Available Help at Discharge: Family (husband works, niece to stay with pt thru next week) Type of Home: House Home Access: Stairs to enter Technical brewer of Steps: 3 - back(mainly uses back entrance), 3 - front Entrance Stairs-Rails: Left (back entrance) Home Layout: Multi-level;1/2 bath on main level Alternate Level Stairs-Number of Steps: flight Alternate Level Stairs-Rails: Left Bathroom Shower/Tub: Walk-in shower;Door   Bathroom Toilet: Standard (main=standard, BR=handicapp height)     Home Equipment: Shower seat    Prior Functioning/Environment Level of Independence: Independent      OT Diagnosis: Generalized weakness;Acute pain   OT Problem List:  n/a, no acute OT needs identified    OT Treatment/Interventions:   n/a, no acute OT needs identified  OT  Goals(Current goals can be found in the care plan section) Acute Rehab OT Goals Patient Stated Goal: go home today OT Goal Formulation: All assessment and education complete, DC therapy  OT Frequency:   n/a, no acute OT needs identified    Barriers to D/C:  None known at this time    End of Session Nurse Communication: Mobility status  Activity Tolerance: Patient tolerated treatment well Patient left: in chair;with call bell/phone within reach;with family/visitor present   Time: 5009-3818 OT Time Calculation (min): 23 min Charges:  OT General Charges $OT Visit: 1 Procedure OT Evaluation $Initial OT Evaluation Tier I: 1 Procedure OT Treatments $Self Care/Home Management : 8-22 mins G-Codes: OT G-codes **NOT FOR INPATIENT CLASS** Functional Limitation: Self care Self Care Current Status (E9937): 0 percent impaired, limited or restricted Self Care Goal Status (J6967): 0 percent impaired, limited or restricted Self Care Discharge Status (E9381): 0 percent impaired, limited or restricted  Sandeep Delagarza 06/07/2015, 9:03 AM

## 2015-06-07 NOTE — Progress Notes (Signed)
    Subjective: Procedure(s) (LRB): Cervical 5-Cervical 6 TOTAL DISC ARTHROPLASTY (N/A) 1 Day Post-Op  Patient reports pain as 2 on 0-10 scale.  Reports none arm pain reports incisional neck pain   Positive void Negative bowel movement Positive flatus Negative chest pain or shortness of breath  Objective: Vital signs in last 24 hours: Temp:  [97.6 F (36.4 C)-98.3 F (36.8 C)] 98.3 F (36.8 C) (09/09 0519) Pulse Rate:  [62-93] 84 (09/09 0519) Resp:  [5-20] 20 (09/09 0519) BP: (121-154)/(67-76) 127/71 mmHg (09/09 0519) SpO2:  [92 %-100 %] 97 % (09/09 0519) Weight:  [77.837 kg (171 lb 9.6 oz)] 77.837 kg (171 lb 9.6 oz) (09/08 0839)  Intake/Output from previous day: 09/08 0701 - 09/09 0700 In: 1650 [I.V.:1400; IV Piggyback:250] Out: 50 [Blood:50]  Labs:  Recent Labs  06/04/15 1523  WBC 8.5  RBC 4.99  HCT 44.6  PLT 289    Recent Labs  06/04/15 1523  NA 139  K 3.4*  CL 107  CO2 24  BUN 17  CREATININE 0.72  GLUCOSE 99  CALCIUM 9.7   No results for input(s): LABPT, INR in the last 72 hours.  Physical Exam: Neurologically intact ABD soft Intact pulses distally Incision: dressing C/D/I Compartment soft  Assessment/Plan: Patient stable  xrays satisfactory Mobilization with physical therapy Encourage incentive spirometry Continue care  Advance diet Up with therapy  Plan on d/c to home today  Melina Schools, MD [place] [phone]

## 2015-06-07 NOTE — Clinical Social Work Note (Signed)
CSW Consult Acknowledged:   CSW received a consult for SNF placement.  Per PT's note the pt is independent. At this time SNF is not needed. CSW will sign off.   [place], MSW, [place]

## 2015-06-07 NOTE — OR Nursing (Signed)
Late entry due to delay code documentation. 

## 2015-06-07 NOTE — Op Note (Signed)
NAMEYILIA, Kathy Watkins NO.:  [id number]  MEDICAL RECORD NO.:  [id number]  LOCATION:  5C21C                        FACILITY:  [facility]  PHYSICIAN:  Kanchan Gal D. Rolena Infante, M.D. 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  OPERATIVE PROCEDURE:  Total disk arthroplasty, C5-6.  FIRST ASSISTANT:  Ronette Deter, P.A.  INSTRUMENTATION SYSTEM USED:  Five wide ProDisc-C without complicating features.  HISTORY:  This is a very pleasant 54 year old woman who has been having progressive debilitating neck and radicular arm pain.  Attempts at conservative management have failed to alleviate her symptoms.  As a result of the ongoing problems, we elected to proceed with surgery.  All appropriate risks, benefits and alternatives were discussed with the patient and consent was obtained.  OPERATIVE NOTE:  The patient was brought to the operating room and placed supine on the operating table.  After successful induction of general anesthesia and endotracheal intubation, TEDs and SCDs were applied.  Inflatable bag was placed underneath the shoulder blades and the anterior surface of the C-arm was tucked at the side and the anterior cervical spine was prepped and draped in a standard fashion. Time-out was taken to confirm patient, procedure, and all other pertinent important data.  Once I was completed, I used x-ray and fluoro in the lateral plane to identify the C5-6 level.  Once this was identified, I infiltrated the incision site with 0.25% Marcaine with epinephrine.  I then made a transverse incision starting at the midline and proceeding to the left.  Sharp dissection was carried out down to the platysma.  The platysma was sharply incised.  I  then continued the standard Smith-Robinson approach to the anterior cervical spine.  Coming down on the medial border of the sternocleidomastoid, I continued my deep cervical dissection.  Once I was through to the deep cervical to the prevertebral fascia, I swept the esophagus and trachea to the right and protected it with the retractor.  Identified the carotid sheath and protected that with a finger.  Using [employer], I then continued to remove the remaining prevertebral fascia to expose the anterior cervical spine.  Once I exposed the C5-6 disk space, I placed a needle into the disk and took a lateral x-ray to confirm that I was at the appropriate level.  Once this was confirmed, I then mobilized the longus colli muscles from the midbody of C5 to the midbody of C6. Caspar retracting blades were placed underneath the longus colli muscle and I deflated the endotracheal cuff, expanded the retractor and reinflated the cuff.  An annulotomy was performed with a 15-blade scalpel and using pituitary rongeurs, I removed the bulk of the disk material and using 2 and 3-mm Kerrison punches, I removed the overhanging osteophyte from the inferior portion of the C5 vertebral body.  Once this was done, I then placed the fluoro in the AP plane and identified the midline of the C6 and C5 vertebral body.  I then used  my awl to punch the hole and then placed distraction pins in the superior aspect of the C5, inferior aspect of C4- C6 vertebral body in the midline.  Once I confirmed satisfactory position of both distraction pins, I then returned my fluoro into the lateral plane.  I distracted the intervertebral space and maintained the distraction pin setup.  I then continued removing the disk material. Using a fine nerve hook, I was able to sweep in the right lateral corner and removed three fragments of disk that had herniated until posterolateral gutter.  Once they were out, I could then use  the rent that was created in the annulus to dissect down and create a plane underneath the posterior longitudinal ligament.  Using my 1-mm Kerrison, I then removed the entire posterior longitudinal ligament.  I then trimmed down the osteophytes.  I swept out again underneath the uncovertebral joint especially on that right side and there was free of any disk fragments and the nerve was completely decompressed.  At this point, I irrigated the wound copiously with normal saline.  I then trialed with spacers and elected to use a size 5 large implant.  This was obtained and the keel cut was made with the burr.  I then malleted the implant to the appropriate depth.  It was properly seated in both planes.  There were no complicating features.  I removed the distraction pins, placed bone wax in the holes and made sure I had hemostasis using FloSeal and bipolar electrocautery.  I then returned the esophagus to midline, irrigated copiously with normal saline.  I then closed the platysma with interrupted 2-0 Vicryl sutures and a 3-0 Monocryl for the skin.  Steri-Strips and dry dressing were applied and the patient was extubated and transferred to the PACU without incident.  At the end of the case, all needle and sponge counts were correct.  There were no adverse intraoperative events.     Dovid Bartko D. Rolena Infante, M.D.     DDB/MEDQ  D:  06/06/2015  T:  06/07/2015  Job:  [id number]

## 2015-06-07 NOTE — Progress Notes (Signed)
Pt discharging at this time with her husband alert, verbal taking all personal belongings. IV discontinued, dry dressing applied. Discharge instructions provided with prescriptions with verbal understanding. No noted distress. No complaints of pain or discomfort.

## 2015-06-17 NOTE — Discharge Summary (Addendum)
Physician Discharge Summary  Patient ID: Kathy Watkins MRN: [id number] DOB/AGE: 04-25-1961 54 y.o.  Admit date: 06/06/2015 Discharge date: 06/17/2015  Admission Diagnoses:  <principal problem not specified>  Discharge Diagnoses:  Active Problems:   Neck pain   Past Medical History  Diagnosis Date  . Hypertension   . Hyperlipidemia   . Colon polyps   . Atypical ductal hyperplasia of breast   . Anxiety   . Lobular carcinoma in situ     Hx: of 2015  . PONV (postoperative nausea and vomiting)   . Breast cancer     "right only" (10/30/2013)    Surgeries: Procedure(s): Cervical 5-Cervical 6 TOTAL DISC ARTHROPLASTY on 06/06/2015   Consultants (if any):    Discharged Condition: Improved  Hospital Course: Kathy Watkins is an 54 y.o. female who was admitted 06/06/2015 with a diagnosis of cervical pain and radicular arm pain and went to the operating room on 06/06/2015 and underwent the above named procedures.  The pt stay in the hospital 1 night and was discharged on 06/07/15/  Her hospital course was uneventful.  She was given perioperative antibiotics:  Anti-infectives    Start     Dose/Rate Route Frequency Ordered Stop   06/06/15 1800  ceFAZolin (ANCEF) IVPB 1 g/50 mL premix     1 g 100 mL/hr over 30 Minutes Intravenous Every 8 hours 06/06/15 1542 06/07/15 0156   06/06/15 1015  ceFAZolin (ANCEF) IVPB 2 g/50 mL premix     2 g 100 mL/hr over 30 Minutes Intravenous 30 min pre-op 06/06/15 0846 06/06/15 1100    .  She was given sequential compression devices, early ambulation, and TED for DVT prophylaxis.  She benefited maximally from the hospital stay and there were no complications.    Recent vital signs:  Filed Vitals:   06/07/15 0957  BP: 117/77  Pulse: 83  Temp: 98.3 F (36.8 C)  Resp: 20    Recent laboratory studies:  Lab Results  Component Value Date   HGB 14.7 06/04/2015   HGB 13.2 11/01/2013   HGB 12.6 10/31/2013   Lab Results  Component Value Date   WBC  8.5 06/04/2015   PLT 289 06/04/2015   No results found for: INR Lab Results  Component Value Date   NA 139 06/04/2015   K 3.4* 06/04/2015   CL 107 06/04/2015   CO2 24 06/04/2015   BUN 17 06/04/2015   CREATININE 0.72 06/04/2015   GLUCOSE 99 06/04/2015    Discharge Medications:     Medication List    STOP taking these medications        morphine 15 MG tablet  Commonly known as:  MSIR      TAKE these medications        ALPRAZolam 0.25 MG tablet  Commonly known as:  XANAX  Take 0.25 mg by mouth 3 (three) times daily as needed for anxiety.     amLODipine 10 MG tablet  Commonly known as:  NORVASC  TAKE 1 TABLET (10 MG TOTAL) BY MOUTH DAILY.     docusate sodium 100 MG capsule  Commonly known as:  COLACE  Take 100 mg by mouth 2 (two) times daily.     ondansetron 4 MG tablet  Commonly known as:  ZOFRAN  Take 1 tablet (4 mg total) by mouth every 8 (eight) hours as needed for nausea or vomiting.     oxyCODONE-acetaminophen 10-325 MG per tablet  Commonly known as:  PERCOCET  Take 1 tablet by mouth  every 4 (four) hours as needed for pain.     rosuvastatin 10 MG tablet  Commonly known as:  CRESTOR  TAKE 1 TABLET (10 MG TOTAL) BY MOUTH DAILY.        Diagnostic Studies: Dg Cervical Spine 2 Or 3 Views  06/06/2015   CLINICAL DATA:  54 year old female status post spinal fusion at C5-C6.  EXAM: CERVICAL SPINE - 2-3 VIEW  COMPARISON:  Intraoperative radiograph dated 06/06/2015  FINDINGS: Metallic discs components noted at the C5-C6 disc space at appearing similar positioning as the prior study. There is mild reversal of normal cervical lordosis which may be positional or due to muscle spasm. No acute fracture or subluxation identified. There is anatomic alignment of the lateral masses of C1 and C2.  There is anterior paraspinal soft tissue swelling with probable pockets of air, likely postoperative. Clinical correlation and follow-up recommended to ensure resolution.  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   Electronically Signed   By: Anner Crete M.D.   On: 06/06/2015 18:54   Dg Cervical Spine 2-3 Views  06/06/2015   CLINICAL DATA:  Cervical disc disease.  EXAM: CERVICAL SPINE - 2-3 VIEW; DG C-ARM 61-120 MIN  COMPARISON:  None.  FINDINGS: Six C-arm images in the AP and lateral projections demonstrate insertion of an artificial disc at C5-6. Components of the artificial disc appear in good position in the AP and lateral projections. Slight reversal of the cervical lordosis at that level.  IMPRESSION: Placement artificial disc at C5-6.   Electronically Signed   By: Lorriane Shire M.D.   On: 06/06/2015 13:38   Dg C-arm 61-120 Min  06/06/2015   CLINICAL DATA:  Cervical disc disease.  EXAM: CERVICAL SPINE - 2-3 VIEW; DG C-ARM 61-120 MIN  COMPARISON:  None.  FINDINGS: Six C-arm images in the AP and lateral projections demonstrate insertion of an artificial disc at C5-6. Components of the artificial disc appear in good position in the AP and lateral projections. Slight reversal of the cervical lordosis at that level.  IMPRESSION: Placement artificial disc at C5-6.   Electronically Signed   By: Lorriane Shire M.D.   On: 06/06/2015 13:38    Disposition: 01-Home or Self Care        Follow-up Information    Follow up with Dahlia Bailiff, MD. Schedule an appointment as soon as possible for a visit in 2 weeks.   Specialty:  Orthopedic Surgery   Why:  If symptoms worsen, For suture removal, For wound re-check   Contact information:   [address] [place] [address] [phone]        Signed: Valinda Hoar 06/17/2015, 1:19 PM

## 2015-06-24 NOTE — Assessment & Plan Note (Signed)
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 She was recommended tamoxifen therapy but patient refused because of concerns for side effects.   She has no new problems or concerns and is here for routine checkup.  Breast Cancer Surveillance:  1. Breast Exam: No palpable nodules 2. There is no indication to do surveillance imaging since she had bilateral mastectomies.  RTC in 1 year

## 2015-06-25 ENCOUNTER — Ambulatory Visit (HOSPITAL_BASED_OUTPATIENT_CLINIC_OR_DEPARTMENT_OTHER): Payer: 59 | Admitting: Hematology and Oncology

## 2015-06-25 ENCOUNTER — Encounter: Payer: Self-pay | Admitting: Hematology and Oncology

## 2015-06-25 VITALS — BP 113/74 | HR 87 | Temp 98.2°F | Resp 18 | Ht 65.0 in | Wt 170.7 lb

## 2015-06-25 DIAGNOSIS — D0501 Lobular carcinoma in situ of right breast: Secondary | ICD-10-CM

## 2015-06-25 NOTE — Progress Notes (Signed)
Patient Care Team: Rosalita Chessman, DO as PCP - General Melina Schools, MD as Consulting Physician (Orthopedic Surgery)  DIAGNOSIS: No matching staging information was found for the patient.  SUMMARY OF ONCOLOGIC HISTORY: Oncology History   Involving     Breast neoplasm, Tis (LCIS)   08/07/2013 Breast MRI Right breast: Clumped enhancement measuring 5 x 2.4 x 2.2 cm: Left breast moderate background enhancement without any masses   08/18/2013 Initial Diagnosis Breast neoplasm, Tis (LCIS)   10/30/2013 Surgery Bilateral mastectomies: Right breast: lobular neoplasia, in situ carcinoma and atypical hyperplasia: Left breast-benign, 3 lymph nodes negative.    CHIEF COMPLIANT: F/U LCIS  INTERVAL HISTORY: Kathy Watkins is a 54 yr old with H/O LCIS RIght breast S/P Bil mastectomies. Patient is here for one-year follow-up. She recently underwent C3-C4 arthroscopic surgery. She is doing much better. She is having low back pain issues now. Denies any problems with the breast. She completed breast reconstruction. She does not have any lumps or nodules.  REVIEW OF SYSTEMS:   Constitutional: Denies fevers, chills or abnormal weight loss Eyes: Denies blurriness of vision Ears, nose, mouth, throat, and face: Denies mucositis or sore throat Respiratory: Denies cough, dyspnea or wheezes Cardiovascular: Denies palpitation, chest discomfort or lower extremity swelling Gastrointestinal:  Denies nausea, heartburn or change in bowel habits Skin: Denies abnormal skin rashes Lymphatics: Denies new lymphadenopathy or easy bruising Neurological:Denies numbness, tingling or new weaknesses Behavioral/Psych: Mood is stable, no new changes  Breast:  denies any pain or lumps or nodules in either breasts All other systems were reviewed with the patient and are negative.  I have reviewed the past medical history, past surgical history, social history and family history with the patient and they are unchanged from  previous note.  ALLERGIES:  has No Known Allergies.  MEDICATIONS:  Current Outpatient Prescriptions  Medication Sig Dispense Refill  . ALPRAZolam (XANAX) 0.25 MG tablet Take 0.25 mg by mouth 3 (three) times daily as needed for anxiety.    Marland Kitchen amLODipine (NORVASC) 10 MG tablet TAKE 1 TABLET (10 MG TOTAL) BY MOUTH DAILY. 90 tablet 1  . rosuvastatin (CRESTOR) 10 MG tablet TAKE 1 TABLET (10 MG TOTAL) BY MOUTH DAILY. 90 tablet 1   No current facility-administered medications for this visit.    PHYSICAL EXAMINATION: ECOG PERFORMANCE STATUS: 1 - Symptomatic but completely ambulatory  Filed Vitals:   06/25/15 1413  BP: 113/74  Pulse: 87  Temp: 98.2 F (36.8 C)  Resp: 18   Filed Weights   06/25/15 1413  Weight: 170 lb 11.2 oz (77.429 kg)    GENERAL:alert, no distress and comfortable SKIN: skin color, texture, turgor are normal, no rashes or significant lesions EYES: normal, Conjunctiva are pink and non-injected, sclera clear OROPHARYNX:no exudate, no erythema and lips, buccal mucosa, and tongue normal  NECK: Recent surgery  LYMPH:  no palpable lymphadenopathy in the cervical, axillary or inguinal LUNGS: clear to auscultation and percussion with normal breathing effort HEART: regular rate & rhythm and no murmurs and no lower extremity edema ABDOMEN:abdomen soft, non-tender and normal bowel sounds Musculoskeletal:no cyanosis of digits and no clubbing  NEURO: alert & oriented x 3 with fluent speech, no focal motor/sensory deficits   LABORATORY DATA:  I have reviewed the data as listed   Chemistry      Component Value Date/Time   NA 139 06/04/2015 1523   K 3.4* 06/04/2015 1523   CL 107 06/04/2015 1523   CO2 24 06/04/2015 1523   BUN 17 06/04/2015  1523   CREATININE 0.72 06/04/2015 1523      Component Value Date/Time   CALCIUM 9.7 06/04/2015 1523   ALKPHOS 70 05/23/2015 1220   AST 21 05/23/2015 1220   ALT 25 05/23/2015 1220   BILITOT 0.3 05/23/2015 1220       Lab  Results  Component Value Date   WBC 8.5 06/04/2015   HGB 14.7 06/04/2015   HCT 44.6 06/04/2015   MCV 89.4 06/04/2015   PLT 289 06/04/2015   NEUTROABS 5.7 09/11/2013   ASSESSMENT & PLAN:  Breast neoplasm, 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 She was recommended tamoxifen therapy but patient refused because of concerns for side effects.   She has no new problems or concerns and is here for routine checkup.  Breast Cancer Surveillance:  1. Breast Exam: No palpable nodules 2. There is no indication to do surveillance imaging since she had bilateral mastectomies.  RTC as needed  No orders of the defined types were placed in this encounter.   The patient has a good understanding of the overall plan. she agrees with it. she will call with any problems that may develop before the next visit here.   Rulon Eisenmenger, MD

## 2015-08-04 ENCOUNTER — Other Ambulatory Visit: Payer: Self-pay | Admitting: Family Medicine

## 2015-08-16 ENCOUNTER — Ambulatory Visit (INDEPENDENT_AMBULATORY_CARE_PROVIDER_SITE_OTHER): Payer: 59 | Admitting: Family Medicine

## 2015-08-16 ENCOUNTER — Encounter: Payer: Self-pay | Admitting: Family Medicine

## 2015-08-16 VITALS — BP 118/80 | HR 94 | Temp 98.4°F | Wt 173.2 lb

## 2015-08-16 DIAGNOSIS — J208 Acute bronchitis due to other specified organisms: Secondary | ICD-10-CM

## 2015-08-16 DIAGNOSIS — R05 Cough: Secondary | ICD-10-CM

## 2015-08-16 DIAGNOSIS — R059 Cough, unspecified: Secondary | ICD-10-CM

## 2015-08-16 MED ORDER — AZITHROMYCIN 250 MG PO TABS: ORAL_TABLET | ORAL | Status: DC

## 2015-08-16 MED ORDER — LEVOCETIRIZINE DIHYDROCHLORIDE 5 MG PO TABS: 5.0000 mg | ORAL_TABLET | Freq: Every evening | ORAL | Status: DC

## 2015-08-16 NOTE — Progress Notes (Signed)
Pre visit review using our clinic review tool, if applicable. No additional management support is needed unless otherwise documented below in the visit note. 

## 2015-08-16 NOTE — Progress Notes (Signed)
Patient ID: Kathy Watkins, female    DOB: 06-09-1961  Age: 54 y.o. MRN: [id number]    Subjective:  Subjective HPI Zyria Pomarico presents for cough since Sunday.  No fever, dry cough,  Some wheezing.  Pt has taken cough drops and hot tea,  No relief.    Review of Systems  Constitutional: Positive for chills. Negative for fever.  HENT: Positive for congestion, postnasal drip, rhinorrhea, sinus pressure and sneezing. Negative for sore throat.   Respiratory: Positive for cough and wheezing. Negative for chest tightness and shortness of breath.   Cardiovascular: Negative for chest pain, palpitations and leg swelling.  Allergic/Immunologic: Negative for environmental allergies.    History Past Medical History  Diagnosis Date  . Hypertension   . Hyperlipidemia   . Colon polyps   . Atypical ductal hyperplasia of breast   . Anxiety   . Lobular carcinoma in situ     Hx: of 2015  . PONV (postoperative nausea and vomiting)   . Breast cancer ([place])     "right only" (10/30/2013)    She has past surgical history that includes Abdominal hysterectomy (1994); Parotid gland tumor excision (Left, 2000); Colonoscopy w/ biopsies and polypectomy; Dilation and curettage of uterus; Breast reconstruction with placement of tissue expander and flex hd (acellular hydrated dermis) (Bilateral, 10/30/2013); Mastectomy (Bilateral, 10/30/2013); Tubal ligation (1989); Breast lumpectomy (Left, 2008); Breast biopsy (Left, 2008); Breast biopsy (Right); Mastectomy, modified radical w/reconstruction (Bilateral, 10/30/2013); Breast reconstruction with placement of tissue expander and flex hd (acellular hydrated dermis) (Bilateral, 10/30/2013); and Cervical disc arthroplasty (N/A, 06/06/2015).   Her family history includes Breast cancer (age of onset: 58) in her mother; Colon cancer (age of onset: 10) in her paternal grandmother; Colon polyps in her sister; Heart attack in her maternal uncle; Hyperlipidemia in her father and sister;  Hypertension in her father, mother, and sister; Kidney disease in her father; Lung cancer in her maternal grandmother; Melanoma (age of onset: 5) in her paternal aunt; Prostate cancer (age of onset: 76) in her father; Stroke (age of onset: 25) in her paternal grandfather.She reports that she has never smoked. She has never used smokeless tobacco. She reports that she drinks about 1.2 oz of alcohol per week. She reports that she does not use illicit drugs.  Current Outpatient Prescriptions on File Prior to Visit  Medication Sig Dispense Refill  . ALPRAZolam (XANAX) 0.25 MG tablet Take 0.25 mg by mouth 3 (three) times daily as needed for anxiety.    Marland Kitchen amLODipine (NORVASC) 10 MG tablet TAKE 1 TABLET (10 MG TOTAL) BY MOUTH DAILY. 90 tablet 1  . rosuvastatin (CRESTOR) 10 MG tablet TAKE 1 TABLET (10 MG TOTAL) BY MOUTH DAILY. 90 tablet 1   No current facility-administered medications on file prior to visit.     Objective:  Objective Physical Exam  Constitutional: She is oriented to person, place, and time. She appears well-developed and well-nourished.  HENT:  Head: Normocephalic and atraumatic.  Eyes: Conjunctivae and EOM are normal.  Neck: Normal range of motion. Neck supple. No JVD present. Carotid bruit is not present. No thyromegaly present.  Cardiovascular: Normal rate, regular rhythm and normal heart sounds.   No murmur heard. Pulmonary/Chest: Effort normal. No respiratory distress. She has decreased breath sounds. She has no wheezes. She has rhonchi. She has no rales. She exhibits no tenderness.  Musculoskeletal: She exhibits no edema.  Neurological: She is alert and oriented to person, place, and time.  Psychiatric: She has a normal mood and affect.  Her behavior is normal.  Nursing note and vitals reviewed.  BP 118/80 mmHg  Pulse 94  Temp(Src) 98.4 F (36.9 C) (Oral)  Wt 173 lb 3.2 oz (78.563 kg)  SpO2 97% Wt Readings from Last 3 Encounters:  08/16/15 173 lb 3.2 oz (78.563 kg)    06/25/15 170 lb 11.2 oz (77.429 kg)  06/06/15 171 lb 9.6 oz (77.837 kg)     Lab Results  Component Value Date   WBC 8.5 06/04/2015   HGB 14.7 06/04/2015   HCT 44.6 06/04/2015   PLT 289 06/04/2015   GLUCOSE 99 06/04/2015   CHOL 156 05/23/2015   TRIG 149.0 05/23/2015   HDL 53.90 05/23/2015   LDLDIRECT 151.0 09/11/2013   LDLCALC 72 05/23/2015   ALT 25 05/23/2015   AST 21 05/23/2015   NA 139 06/04/2015   K 3.4* 06/04/2015   CL 107 06/04/2015   CREATININE 0.72 06/04/2015   BUN 17 06/04/2015   CO2 24 06/04/2015   TSH 1.91 09/11/2013    No results found.   Assessment & Plan:  Plan I am having Ms. Cassaro start on azithromycin and levocetirizine. I am also having her maintain her ALPRAZolam, rosuvastatin, amLODipine, and HYDROcodone-acetaminophen.  Meds ordered this encounter  Medications  . HYDROcodone-acetaminophen (NORCO/VICODIN) 5-325 MG tablet    Sig: Take 1 tablet by mouth daily.    Refill:  0  . azithromycin (ZITHROMAX Z-PAK) 250 MG tablet    Sig: As directed    Dispense:  6 each    Refill:  0  . levocetirizine (XYZAL) 5 MG tablet    Sig: Take 1 tablet (5 mg total) by mouth every evening.    Dispense:  30 tablet    Refill:  5    Problem List Items Addressed This Visit    COUGH   Relevant Medications   levocetirizine (XYZAL) 5 MG tablet    Other Visit Diagnoses    Acute bronchitis due to other specified organisms    -  Primary    Relevant Medications    azithromycin (ZITHROMAX Z-PAK) 250 MG tablet       Follow-up: Return if symptoms worsen or fail to improve.  Garnet Koyanagi, DO

## 2015-08-16 NOTE — Patient Instructions (Signed)

## 2015-09-20 ENCOUNTER — Ambulatory Visit (INDEPENDENT_AMBULATORY_CARE_PROVIDER_SITE_OTHER): Payer: 59

## 2015-09-20 DIAGNOSIS — Z23 Encounter for immunization: Secondary | ICD-10-CM

## 2015-09-20 NOTE — Progress Notes (Signed)
Pre visit review using our clinic review tool, if applicable. No additional management support is needed unless otherwise documented below in the visit note.   Pt received flu shot.  Education and VIS provided.  Pt stated understanding.  No questions or concerns voiced.   Pt tolerated injection well.  No signs of a reaction upon leaving the clinic.

## 2015-10-05 ENCOUNTER — Other Ambulatory Visit: Payer: Self-pay | Admitting: Family Medicine

## 2015-11-19 ENCOUNTER — Encounter: Payer: Self-pay | Admitting: Family Medicine

## 2015-11-19 ENCOUNTER — Ambulatory Visit (INDEPENDENT_AMBULATORY_CARE_PROVIDER_SITE_OTHER): Payer: 59 | Admitting: Family Medicine

## 2015-11-19 VITALS — BP 126/84 | HR 74 | Temp 98.1°F | Ht 65.0 in | Wt 168.0 lb

## 2015-11-19 DIAGNOSIS — Z114 Encounter for screening for human immunodeficiency virus [HIV]: Secondary | ICD-10-CM

## 2015-11-19 DIAGNOSIS — E785 Hyperlipidemia, unspecified: Secondary | ICD-10-CM | POA: Diagnosis not present

## 2015-11-19 DIAGNOSIS — I10 Essential (primary) hypertension: Secondary | ICD-10-CM

## 2015-11-19 DIAGNOSIS — Z1159 Encounter for screening for other viral diseases: Secondary | ICD-10-CM

## 2015-11-19 LAB — COMPREHENSIVE METABOLIC PANEL
ALT: 26 U/L (ref 0–35)
AST: 24 U/L (ref 0–37)
Albumin: 4.5 g/dL (ref 3.5–5.2)
Alkaline Phosphatase: 78 U/L (ref 39–117)
BUN: 13 mg/dL (ref 6–23)
CALCIUM: 9.7 mg/dL (ref 8.4–10.5)
CHLORIDE: 105 meq/L (ref 96–112)
CO2: 28 meq/L (ref 19–32)
Creatinine, Ser: 0.74 mg/dL (ref 0.40–1.20)
GFR: 86.34 mL/min (ref >60.00)
GLUCOSE: 98 mg/dL (ref 70–99)
Potassium: 3.9 mEq/L (ref 3.5–5.1)
Sodium: 141 mEq/L (ref 135–145)
Total Bilirubin: 0.5 mg/dL (ref 0.2–1.2)
Total Protein: 7.6 g/dL (ref 6.0–8.3)

## 2015-11-19 LAB — LIPID PANEL
CHOL/HDL RATIO: 3
Cholesterol: 165 mg/dL (ref 0–200)
HDL: 63.5 mg/dL (ref >39.00)
LDL CALC: 83 mg/dL (ref 0–99)
NONHDL: 101.97
TRIGLYCERIDES: 97 mg/dL (ref 0.0–149.0)
VLDL: 19.4 mg/dL (ref 0.0–40.0)

## 2015-11-19 MED ORDER — ROSUVASTATIN CALCIUM 10 MG PO TABS: 10.0000 mg | ORAL_TABLET | Freq: Every day | ORAL | Status: DC

## 2015-11-19 MED ORDER — AMLODIPINE BESYLATE 10 MG PO TABS: ORAL_TABLET | ORAL | Status: DC

## 2015-11-19 NOTE — Progress Notes (Signed)
Pre visit review using our clinic review tool, if applicable. No additional management support is needed unless otherwise documented below in the visit note. 

## 2015-11-19 NOTE — Progress Notes (Signed)
Patient ID: Kathy Watkins, female    DOB: [birth year]  Age: 55 y.o. MRN: [id number]    Subjective:  Subjective HPI Kathy Watkins presents for f/u cholesterol and bp.  No complaints.  Review of Systems  Constitutional: Negative for diaphoresis, appetite change, fatigue and unexpected weight change.  Eyes: Negative for pain, redness and visual disturbance.  Respiratory: Negative for cough, chest tightness, shortness of breath and wheezing.   Cardiovascular: Negative for chest pain, palpitations and leg swelling.  Endocrine: Negative for cold intolerance, heat intolerance, polydipsia, polyphagia and polyuria.  Genitourinary: Negative for dysuria, frequency and difficulty urinating.  Neurological: Negative for dizziness, light-headedness, numbness and headaches.    History Past Medical History  Diagnosis Date  . Hypertension   . Hyperlipidemia   . Colon polyps   . Atypical ductal hyperplasia of breast   . Anxiety   . Lobular carcinoma in situ     Hx: of 2015  . PONV (postoperative nausea and vomiting)   . Breast cancer ([place])     "right only" (10/30/2013)    She has past surgical history that includes Abdominal hysterectomy (1994); Parotid gland tumor excision (Left, 2000); Colonoscopy w/ biopsies and polypectomy; Dilation and curettage of uterus; Breast reconstruction with placement of tissue expander and flex hd (acellular hydrated dermis) (Bilateral, 10/30/2013); Mastectomy (Bilateral, 10/30/2013); Tubal ligation (1989); Breast lumpectomy (Left, 2008); Breast biopsy (Left, 2008); Breast biopsy (Right); Mastectomy, modified radical w/reconstruction (Bilateral, 10/30/2013); Breast reconstruction with placement of tissue expander and flex hd (acellular hydrated dermis) (Bilateral, 10/30/2013); and Cervical disc arthroplasty (N/A, 06/06/2015).   Her family history includes Breast cancer (age of onset: 4) in her mother; Colon cancer (age of onset: 48) in her paternal grandmother; Colon polyps in her  sister; Heart attack in her maternal uncle; Hyperlipidemia in her father and sister; Hypertension in her father, mother, and sister; Kidney disease in her father; Lung cancer in her maternal grandmother; Melanoma (age of onset: 60) in her paternal aunt; Prostate cancer (age of onset: 47) in her father; Stroke (age of onset: 30) in her paternal grandfather.She reports that she has never smoked. She has never used smokeless tobacco. She reports that she drinks about 1.2 oz of alcohol per week. She reports that she does not use illicit drugs.  Current Outpatient Prescriptions on File Prior to Visit  Medication Sig Dispense Refill  . ALPRAZolam (XANAX) 0.25 MG tablet Take 0.25 mg by mouth 3 (three) times daily as needed for anxiety.     No current facility-administered medications on file prior to visit.     Objective:  Objective Physical Exam  Constitutional: She is oriented to person, place, and time. She appears well-developed and well-nourished.  HENT:  Head: Normocephalic and atraumatic.  Eyes: Conjunctivae and EOM are normal.  Neck: Normal range of motion. Neck supple. No JVD present. Carotid bruit is not present. No thyromegaly present.  Cardiovascular: Normal rate, regular rhythm and normal heart sounds.   No murmur heard. Pulmonary/Chest: Effort normal and breath sounds normal. No respiratory distress. She has no wheezes. She has no rales. She exhibits no tenderness.  Musculoskeletal: She exhibits no edema.  Neurological: She is alert and oriented to person, place, and time.  Psychiatric: She has a normal mood and affect. Her behavior is normal. Judgment and thought content normal.  Nursing note and vitals reviewed.  BP 126/84 mmHg  Pulse 74  Temp(Src) 98.1 F (36.7 C) (Oral)  Ht _0  (1.651 m)  Wt 168 lb (76.204 kg)  BMI  27.96 kg/m2  SpO2 98% Wt Readings from Last 3 Encounters:  11/19/15 168 lb (76.204 kg)  08/16/15 173 lb 3.2 oz (78.563 kg)  06/25/15 170 lb 11.2 oz (77.429  kg)     Lab Results  Component Value Date   WBC 8.5 06/04/2015   HGB 14.7 06/04/2015   HCT 44.6 06/04/2015   PLT 289 06/04/2015   GLUCOSE 98 11/19/2015   CHOL 165 11/19/2015   TRIG 97.0 11/19/2015   HDL 63.50 11/19/2015   LDLDIRECT 151.0 09/11/2013   LDLCALC 83 11/19/2015   ALT 26 11/19/2015   AST 24 11/19/2015   NA 141 11/19/2015   K 3.9 11/19/2015   CL 105 11/19/2015   CREATININE 0.74 11/19/2015   BUN 13 11/19/2015   CO2 28 11/19/2015   TSH 1.91 09/11/2013    No results found.   Assessment & Plan:  Plan I have discontinued Ms. Coppinger's HYDROcodone-acetaminophen, azithromycin, and levocetirizine. I am also having her maintain her ALPRAZolam, rosuvastatin, and amLODipine.  Meds ordered this encounter  Medications  . rosuvastatin (CRESTOR) 10 MG tablet    Sig: Take 1 tablet (10 mg total) by mouth daily.    Dispense:  90 tablet    Refill:  0  . amLODipine (NORVASC) 10 MG tablet    Sig: TAKE 1 TABLET (10 MG TOTAL) BY MOUTH DAILY.    Dispense:  90 tablet    Refill:  1    Problem List Items Addressed This Visit      Unprioritized   Hyperlipidemia   Relevant Medications   rosuvastatin (CRESTOR) 10 MG tablet   amLODipine (NORVASC) 10 MG tablet   Other Relevant Orders   Comp Met (CMET) (Completed)   Lipid panel (Completed)   Essential hypertension - Primary   Relevant Medications   rosuvastatin (CRESTOR) 10 MG tablet   amLODipine (NORVASC) 10 MG tablet   Other Relevant Orders   Comp Met (CMET) (Completed)   Lipid panel (Completed)    Other Visit Diagnoses    Need for hepatitis C screening test        Relevant Orders    Hepatitis C antibody    Screening for HIV (human immunodeficiency virus)        Relevant Orders    HIV antibody       Follow-up: Return in about 6 months (around 05/18/2016), or if symptoms worsen or fail to improve, for hypertension, hyperlipidemia.  Garnet Koyanagi, DO

## 2015-11-19 NOTE — Patient Instructions (Signed)

## 2015-11-20 LAB — HEPATITIS C ANTIBODY: HCV AB: NEGATIVE

## 2015-11-20 LAB — HIV ANTIBODY (ROUTINE TESTING W REFLEX): HIV: NONREACTIVE

## 2015-12-03 ENCOUNTER — Encounter: Payer: Self-pay | Admitting: Family Medicine

## 2015-12-03 ENCOUNTER — Other Ambulatory Visit: Payer: Self-pay | Admitting: Family Medicine

## 2015-12-03 DIAGNOSIS — E785 Hyperlipidemia, unspecified: Secondary | ICD-10-CM

## 2015-12-03 MED ORDER — ROSUVASTATIN CALCIUM 10 MG PO TABS: 10.0000 mg | ORAL_TABLET | Freq: Every day | ORAL | Status: DC

## 2016-01-13 ENCOUNTER — Other Ambulatory Visit: Payer: Self-pay | Admitting: Family Medicine

## 2016-02-10 ENCOUNTER — Other Ambulatory Visit: Payer: Self-pay | Admitting: Family Medicine

## 2016-05-22 ENCOUNTER — Encounter: Payer: Self-pay | Admitting: Family Medicine

## 2016-05-22 ENCOUNTER — Ambulatory Visit (INDEPENDENT_AMBULATORY_CARE_PROVIDER_SITE_OTHER): Payer: 59 | Admitting: Family Medicine

## 2016-05-22 VITALS — BP 122/88 | HR 78 | Temp 98.0°F | Ht 65.0 in | Wt 148.2 lb

## 2016-05-22 DIAGNOSIS — I10 Essential (primary) hypertension: Secondary | ICD-10-CM

## 2016-05-22 DIAGNOSIS — E785 Hyperlipidemia, unspecified: Secondary | ICD-10-CM

## 2016-05-22 LAB — COMPREHENSIVE METABOLIC PANEL
ALK PHOS: 79 U/L (ref 39–117)
ALT: 26 U/L (ref 0–35)
AST: 22 U/L (ref 0–37)
Albumin: 4.3 g/dL (ref 3.5–5.2)
BILIRUBIN TOTAL: 0.5 mg/dL (ref 0.2–1.2)
BUN: 11 mg/dL (ref 6–23)
CO2: 30 mEq/L (ref 19–32)
CREATININE: 0.86 mg/dL (ref 0.40–1.20)
Calcium: 9.4 mg/dL (ref 8.4–10.5)
Chloride: 105 mEq/L (ref 96–112)
GFR: 72.46 mL/min (ref >60.00)
GLUCOSE: 91 mg/dL (ref 70–99)
Potassium: 4.1 mEq/L (ref 3.5–5.1)
SODIUM: 140 meq/L (ref 135–145)
TOTAL PROTEIN: 7.3 g/dL (ref 6.0–8.3)

## 2016-05-22 LAB — LIPID PANEL
Cholesterol: 137 mg/dL (ref 0–200)
HDL: 55.9 mg/dL (ref >39.00)
LDL Cholesterol: 70 mg/dL (ref 0–99)
NONHDL: 80.79
Total CHOL/HDL Ratio: 2
Triglycerides: 53 mg/dL (ref 0.0–149.0)
VLDL: 10.6 mg/dL (ref 0.0–40.0)

## 2016-05-22 MED ORDER — ROSUVASTATIN CALCIUM 10 MG PO TABS: 10.0000 mg | ORAL_TABLET | Freq: Every day | ORAL | 1 refills | Status: DC

## 2016-05-22 MED ORDER — AMLODIPINE BESYLATE 10 MG PO TABS: ORAL_TABLET | ORAL | 1 refills | Status: DC

## 2016-05-22 NOTE — Assessment & Plan Note (Signed)
Stable con't meds 

## 2016-05-22 NOTE — Patient Instructions (Signed)
Hypertension Hypertension, commonly called high blood pressure, is when the force of blood pumping through your arteries is too strong. Your arteries are the blood vessels that carry blood from your heart throughout your body. A blood pressure reading consists of a higher number over a lower number, such as 110/72. The higher number (systolic) is the pressure inside your arteries when your heart pumps. The lower number (diastolic) is the pressure inside your arteries when your heart relaxes. Ideally you want your blood pressure below 120/80. Hypertension forces your heart to work harder to pump blood. Your arteries may become narrow or stiff. Having untreated or uncontrolled hypertension can cause heart attack, stroke, kidney disease, and other problems. RISK FACTORS Some risk factors for high blood pressure are controllable. Others are not.  Risk factors you cannot control include:   Race. You may be at higher risk if you are African American.  Age. Risk increases with age.  Gender. Men are at higher risk than women before age 45 years. After age 65, women are at higher risk than men. Risk factors you can control include:  Not getting enough exercise or physical activity.  Being overweight.  Getting too much fat, sugar, calories, or salt in your diet.  Drinking too much alcohol. SIGNS AND SYMPTOMS Hypertension does not usually cause signs or symptoms. Extremely high blood pressure (hypertensive crisis) may cause headache, anxiety, shortness of breath, and nosebleed. DIAGNOSIS To check if you have hypertension, your health care provider will measure your blood pressure while you are seated, with your arm held at the level of your heart. It should be measured at least twice using the same arm. Certain conditions can cause a difference in blood pressure between your right and left arms. A blood pressure reading that is higher than normal on one occasion does not mean that you need treatment. If  it is not clear whether you have high blood pressure, you may be asked to return on a different day to have your blood pressure checked again. Or, you may be asked to monitor your blood pressure at home for 1 or more weeks. TREATMENT Treating high blood pressure includes making lifestyle changes and possibly taking medicine. Living a healthy lifestyle can help lower high blood pressure. You may need to change some of your habits. Lifestyle changes may include:  Following the DASH diet. This diet is high in fruits, vegetables, and whole grains. It is low in salt, red meat, and added sugars.  Keep your sodium intake below 2,300 mg per day.  Getting at least 30-45 minutes of aerobic exercise at least 4 times per week.  Losing weight if necessary.  Not smoking.  Limiting alcoholic beverages.  Learning ways to reduce stress. Your health care provider may prescribe medicine if lifestyle changes are not enough to get your blood pressure under control, and if one of the following is true:  You are 18-59 years of age and your systolic blood pressure is above 140.  You are 60 years of age or older, and your systolic blood pressure is above 150.  Your diastolic blood pressure is above 90.  You have diabetes, and your systolic blood pressure is over 140 or your diastolic blood pressure is over 90.  You have kidney disease and your blood pressure is above 140/90.  You have heart disease and your blood pressure is above 140/90. Your personal target blood pressure may vary depending on your medical conditions, your age, and other factors. HOME CARE INSTRUCTIONS    Have your blood pressure rechecked as directed by your health care provider.   Take medicines only as directed by your health care provider. Follow the directions carefully. Blood pressure medicines must be taken as prescribed. The medicine does not work as well when you skip doses. Skipping doses also puts you at risk for  problems.  Do not smoke.   Monitor your blood pressure at home as directed by your health care provider. SEEK MEDICAL CARE IF:   You think you are having a reaction to medicines taken.  You have recurrent headaches or feel dizzy.  You have swelling in your ankles.  You have trouble with your vision. SEEK IMMEDIATE MEDICAL CARE IF:  You develop a severe headache or confusion.  You have unusual weakness, numbness, or feel faint.  You have severe chest or abdominal pain.  You vomit repeatedly.  You have trouble breathing. MAKE SURE YOU:   Understand these instructions.  Will watch your condition.  Will get help right away if you are not doing well or get worse.   This information is not intended to replace advice given to you by your health care provider. Make sure you discuss any questions you have with your health care provider.   Document Released: 09/14/2005 Document Revised: 01/29/2015 Document Reviewed: 07/07/2013 [employer] Interactive Patient Education 2016 [employer]  

## 2016-05-22 NOTE — Progress Notes (Signed)
Patient ID: Kathy Watkins, female    DOB: [birth year]  Age: 55 y.o. MRN: [id number]    Subjective:  Subjective  HPI Kathy Watkins presents for bp and lipids.  Pt has been watching what she eats and has increased her walking.  Review of Systems  Constitutional: Negative for appetite change, diaphoresis, fatigue and unexpected weight change.  Eyes: Negative for pain, redness and visual disturbance.  Respiratory: Negative for cough, chest tightness, shortness of breath and wheezing.   Cardiovascular: Negative for chest pain, palpitations and leg swelling.  Endocrine: Negative for cold intolerance, heat intolerance, polydipsia, polyphagia and polyuria.  Genitourinary: Negative for difficulty urinating, dysuria and frequency.  Neurological: Negative for dizziness, light-headedness, numbness and headaches.    History Past Medical History:  Diagnosis Date  . Anxiety   . Atypical ductal hyperplasia of breast   . Breast cancer ([place])    "right only" (10/30/2013)  . Colon polyps   . Hyperlipidemia   . Hypertension   . Lobular carcinoma in situ    Hx: of 2015  . PONV (postoperative nausea and vomiting)     She has a past surgical history that includes Abdominal hysterectomy (1994); Parotid gland tumor excision (Left, 2000); Colonoscopy w/ biopsies and polypectomy; Dilation and curettage of uterus; Breast reconstruction with placement of tissue expander and flex hd (acellular hydrated dermis) (Bilateral, 10/30/2013); Mastectomy (Bilateral, 10/30/2013); Tubal ligation (1989); Breast lumpectomy (Left, 2008); Breast biopsy (Left, 2008); Breast biopsy (Right); Mastectomy, modified radical w/reconstruction (Bilateral, 10/30/2013); Breast reconstruction with placement of tissue expander and flex hd (acellular hydrated dermis) (Bilateral, 10/30/2013); and Cervical disc arthroplasty (N/A, 06/06/2015).   Her family history includes Breast cancer (age of onset: 66) in her mother; Colon cancer (age of onset: 53) in  her paternal grandmother; Colon polyps in her sister; Heart attack in her maternal uncle; Hyperlipidemia in her father and sister; Hypertension in her father, mother, and sister; Kidney disease in her father; Lung cancer in her maternal grandmother; Melanoma (age of onset: 22) in her paternal aunt; Prostate cancer (age of onset: 43) in her father; Stroke (age of onset: 17) in her paternal grandfather.She reports that she has never smoked. She has never used smokeless tobacco. She reports that she drinks about 1.2 oz of alcohol per week . She reports that she does not use drugs.  Current Outpatient Prescriptions on File Prior to Visit  Medication Sig Dispense Refill  . ALPRAZolam (XANAX) 0.25 MG tablet Take 0.25 mg by mouth 3 (three) times daily as needed for anxiety.     No current facility-administered medications on file prior to visit.      Objective:  Objective  Physical Exam  Constitutional: She is oriented to person, place, and time. She appears well-developed and well-nourished.  HENT:  Head: Normocephalic and atraumatic.  Eyes: Conjunctivae and EOM are normal.  Neck: Normal range of motion. Neck supple. No JVD present. Carotid bruit is not present. No thyromegaly present.  Cardiovascular: Normal rate, regular rhythm and normal heart sounds.   No murmur heard. Pulmonary/Chest: Effort normal and breath sounds normal. No respiratory distress. She has no wheezes. She has no rales. She exhibits no tenderness.  Musculoskeletal: She exhibits no edema.  Neurological: She is alert and oriented to person, place, and time.  Psychiatric: She has a normal mood and affect. Her behavior is normal. Judgment and thought content normal.  Nursing note and vitals reviewed.  BP 122/88 (BP Location: Left Arm, Patient Position: Sitting, Cuff Size: Normal)   Pulse 78  Temp 98 F (36.7 C) (Oral)   Ht 5\' 5"  (1.651 m)   Wt 148 lb 3.2 oz (67.2 kg)   SpO2 98%   BMI 24.66 kg/m  Wt Readings from Last 3  Encounters:  05/22/16 148 lb 3.2 oz (67.2 kg)  11/19/15 168 lb (76.2 kg)  08/16/15 173 lb 3.2 oz (78.6 kg)     Lab Results  Component Value Date   WBC 8.5 06/04/2015   HGB 14.7 06/04/2015   HCT 44.6 06/04/2015   PLT 289 06/04/2015   GLUCOSE 98 11/19/2015   CHOL 165 11/19/2015   TRIG 97.0 11/19/2015   HDL 63.50 11/19/2015   LDLDIRECT 151.0 09/11/2013   LDLCALC 83 11/19/2015   ALT 26 11/19/2015   AST 24 11/19/2015   NA 141 11/19/2015   K 3.9 11/19/2015   CL 105 11/19/2015   CREATININE 0.74 11/19/2015   BUN 13 11/19/2015   CO2 28 11/19/2015   TSH 1.91 09/11/2013    No results found.   Assessment & Plan:  Plan  I have changed Ms. Zentz's rosuvastatin. I am also having her maintain her ALPRAZolam and amLODipine.  Meds ordered this encounter  Medications  . rosuvastatin (CRESTOR) 10 MG tablet    Sig: Take 1 tablet (10 mg total) by mouth daily.    Dispense:  90 tablet    Refill:  1  . amLODipine (NORVASC) 10 MG tablet    Sig: TAKE 1 TABLET (10 MG TOTAL) BY MOUTH DAILY.    Dispense:  90 tablet    Refill:  1    Problem List Items Addressed This Visit      Unprioritized   Essential hypertension    Stable con't meds      Relevant Medications   rosuvastatin (CRESTOR) 10 MG tablet   amLODipine (NORVASC) 10 MG tablet   Other Relevant Orders   Comprehensive metabolic panel   Hyperlipidemia - Primary    Check labs con't crestor      Relevant Medications   rosuvastatin (CRESTOR) 10 MG tablet   amLODipine (NORVASC) 10 MG tablet   Other Relevant Orders   Lipid panel    Other Visit Diagnoses   None.     Follow-up: Return in about 6 months (around 11/22/2016) for annual exam, fasting.  Ann Held, DO

## 2016-05-22 NOTE — Progress Notes (Signed)
Pre visit review using our clinic review tool, if applicable. No additional management support is needed unless otherwise documented below in the visit note. 

## 2016-06-19 ENCOUNTER — Other Ambulatory Visit: Payer: Self-pay | Admitting: General Surgery

## 2016-06-19 DIAGNOSIS — N63 Unspecified lump in unspecified breast: Secondary | ICD-10-CM

## 2016-06-25 ENCOUNTER — Other Ambulatory Visit: Payer: Self-pay | Admitting: General Surgery

## 2016-06-25 ENCOUNTER — Ambulatory Visit
Admission: RE | Admit: 2016-06-25 | Discharge: 2016-06-25 | Disposition: A | Discharge status: Home / Self Care | Payer: 59 | Source: Ambulatory Visit | Attending: General Surgery | Admitting: General Surgery

## 2016-06-25 DIAGNOSIS — N63 Unspecified lump in unspecified breast: Secondary | ICD-10-CM

## 2016-06-25 DIAGNOSIS — R2232 Localized swelling, mass and lump, left upper limb: Secondary | ICD-10-CM

## 2016-07-27 ENCOUNTER — Ambulatory Visit (INDEPENDENT_AMBULATORY_CARE_PROVIDER_SITE_OTHER): Payer: 59

## 2016-07-27 DIAGNOSIS — Z23 Encounter for immunization: Secondary | ICD-10-CM | POA: Diagnosis not present

## 2016-08-16 ENCOUNTER — Other Ambulatory Visit: Payer: Self-pay | Admitting: Family Medicine

## 2016-08-17 NOTE — Telephone Encounter (Signed)
Refill sent per [facility] refill protocol/SLS  

## 2016-11-23 ENCOUNTER — Other Ambulatory Visit: Payer: Self-pay | Admitting: Family Medicine

## 2016-11-23 ENCOUNTER — Encounter: Payer: 59 | Admitting: Family Medicine

## 2016-12-06 ENCOUNTER — Encounter: Payer: Self-pay | Admitting: Family Medicine

## 2016-12-08 ENCOUNTER — Other Ambulatory Visit: Payer: Self-pay | Admitting: Family Medicine

## 2016-12-08 MED ORDER — ALPRAZOLAM 0.25 MG PO TABS: 0.2500 mg | ORAL_TABLET | Freq: Three times a day (TID) | ORAL | 0 refills | Status: DC | PRN

## 2016-12-08 NOTE — Telephone Encounter (Signed)
Refill x1 

## 2016-12-14 ENCOUNTER — Encounter: Payer: Self-pay | Admitting: Family Medicine

## 2016-12-14 ENCOUNTER — Ambulatory Visit (INDEPENDENT_AMBULATORY_CARE_PROVIDER_SITE_OTHER): Payer: 59 | Admitting: Family Medicine

## 2016-12-14 VITALS — BP 102/82 | HR 77 | Temp 98.3°F | Resp 16 | Ht 65.0 in | Wt 150.0 lb

## 2016-12-14 DIAGNOSIS — F419 Anxiety disorder, unspecified: Secondary | ICD-10-CM | POA: Diagnosis not present

## 2016-12-14 DIAGNOSIS — I10 Essential (primary) hypertension: Secondary | ICD-10-CM | POA: Diagnosis not present

## 2016-12-14 DIAGNOSIS — E785 Hyperlipidemia, unspecified: Secondary | ICD-10-CM

## 2016-12-14 DIAGNOSIS — D0590 Unspecified type of carcinoma in situ of unspecified breast: Secondary | ICD-10-CM

## 2016-12-14 DIAGNOSIS — Z0000 Encounter for general adult medical examination without abnormal findings: Secondary | ICD-10-CM | POA: Diagnosis not present

## 2016-12-14 LAB — COMPREHENSIVE METABOLIC PANEL
ALBUMIN: 4.3 g/dL (ref 3.5–5.2)
ALK PHOS: 78 U/L (ref 39–117)
ALT: 28 U/L (ref 0–35)
AST: 24 U/L (ref 0–37)
BUN: 15 mg/dL (ref 6–23)
CHLORIDE: 107 meq/L (ref 96–112)
CO2: 29 mEq/L (ref 19–32)
CREATININE: 0.85 mg/dL (ref 0.40–1.20)
Calcium: 10.1 mg/dL (ref 8.4–10.5)
GFR: 73.3 mL/min (ref >60.00)
Glucose, Bld: 89 mg/dL (ref 70–99)
Potassium: 5.4 mEq/L — ABNORMAL HIGH (ref 3.5–5.1)
SODIUM: 143 meq/L (ref 135–145)
TOTAL PROTEIN: 7.2 g/dL (ref 6.0–8.3)
Total Bilirubin: 0.6 mg/dL (ref 0.2–1.2)

## 2016-12-14 LAB — LIPID PANEL
CHOLESTEROL: 175 mg/dL (ref 0–200)
HDL: 75.5 mg/dL (ref >39.00)
LDL CALC: 89 mg/dL (ref 0–99)
NonHDL: 99.71
Total CHOL/HDL Ratio: 2
Triglycerides: 56 mg/dL (ref 0.0–149.0)
VLDL: 11.2 mg/dL (ref 0.0–40.0)

## 2016-12-14 LAB — CBC
HCT: 44.4 % (ref 36.0–46.0)
Hemoglobin: 14.4 g/dL (ref 12.0–15.0)
MCHC: 32.5 g/dL (ref 30.0–36.0)
MCV: 89.9 fl (ref 78.0–100.0)
Platelets: 292 10*3/uL (ref 150.0–400.0)
RBC: 4.93 Mil/uL (ref 3.87–5.11)
RDW: 13.5 % (ref 11.5–15.5)
WBC: 5.8 10*3/uL (ref 4.0–10.5)

## 2016-12-14 LAB — TSH: TSH: 1.52 u[IU]/mL (ref 0.35–4.50)

## 2016-12-14 NOTE — Assessment & Plan Note (Signed)
Well controlled, no changes to meds. Encouraged heart healthy diet such as the DASH diet and exercise as tolerated.  °

## 2016-12-14 NOTE — Assessment & Plan Note (Addendum)
ghm utd Check labs See AVS Check ifob  D/w shingrix-- she will check with insurance

## 2016-12-14 NOTE — Patient Instructions (Addendum)
SHINGRIX--- shingles vaccine-- please call your ins co to see if it is covered    Preventive Care 40-64 Years, Female Preventive care refers to lifestyle choices and visits with your health care provider that can promote health and wellness. What does preventive care include?  A yearly physical exam. This is also called an annual well check.  Dental exams once or twice a year.  Routine eye exams. Ask your health care provider how often you should have your eyes checked.  Personal lifestyle choices, including:  Daily care of your teeth and gums.  Regular physical activity.  Eating a healthy diet.  Avoiding tobacco and drug use.  Limiting alcohol use.  Practicing safe sex.  Taking low-dose aspirin daily starting at age 77.  Taking vitamin and mineral supplements as recommended by your health care provider. What happens during an annual well check? The services and screenings done by your health care provider during your annual well check will depend on your age, overall health, lifestyle risk factors, and family history of disease. Counseling  Your health care provider may ask you questions about your:  Alcohol use.  Tobacco use.  Drug use.  Emotional well-being.  Home and relationship well-being.  Sexual activity.  Eating habits.  Work and work Astron[employer].  Method of birth control.  Menstrual cycle.  Pregnancy history. Screening  You may have the following tests or measurements:  Height, weight, and BMI.  Blood pressure.  Lipid and cholesterol levels. These may be checked every 5 years, or more frequently if you are over 17 years old.  Skin check.  Lung cancer screening. You may have this screening every year starting at age 84 if you have a 30-pack-year history of smoking and currently smoke or have quit within the past 15 years.  Fecal occult blood test (FOBT) of the stool. You may have this test every year starting at age 29.  Flexible  sigmoidoscopy or colonoscopy. You may have a sigmoidoscopy every 5 years or a colonoscopy every 10 years starting at age 16.  Hepatitis C blood test.  Hepatitis B blood test.  Sexually transmitted disease (STD) testing.  Diabetes screening. This is done by checking your blood sugar (glucose) after you have not eaten for a while (fasting). You may have this done every 1-3 years.  Mammogram. This may be done every 1-2 years. Talk to your health care provider about when you should start having regular mammograms. This may depend on whether you have a family history of breast cancer.  BRCA-related cancer screening. This may be done if you have a family history of breast, ovarian, tubal, or peritoneal cancers.  Pelvic exam and Pap test. This may be done every 3 years starting at age 41. Starting at age 73, this may be done every 5 years if you have a Pap test in combination with an HPV test.  Bone density scan. This is done to screen for osteoporosis. You may have this scan if you are at high risk for osteoporosis. Discuss your test results, treatment options, and if necessary, the need for more tests with your health care provider. Vaccines  Your health care provider may recommend certain vaccines, such as:  Influenza vaccine. This is recommended every year.  Tetanus, diphtheria, and acellular pertussis (Tdap, Td) vaccine. You may need a Td booster every 10 years.  Varicella vaccine. You may need this if you have not been vaccinated.  Zoster vaccine. You may need this after age 29.  Measles, mumps,  and rubella (MMR) vaccine. You may need at least one dose of MMR if you were born in 1957 or later. You may also need a second dose.  Pneumococcal 13-valent conjugate (PCV13) vaccine. You may need this if you have certain conditions and were not previously vaccinated.  Pneumococcal polysaccharide (PPSV23) vaccine. You may need one or two doses if you smoke cigarettes or if you have certain  conditions.  Meningococcal vaccine. You may need this if you have certain conditions.  Hepatitis A vaccine. You may need this if you have certain conditions or if you travel or work in places where you may be exposed to hepatitis A.  Hepatitis B vaccine. You may need this if you have certain conditions or if you travel or work in places where you may be exposed to hepatitis B.  Haemophilus influenzae type b (Hib) vaccine. You may need this if you have certain conditions. Talk to your health care provider about which screenings and vaccines you need and how often you need them. This information is not intended to replace advice given to you by your health care provider. Make sure you discuss any questions you have with your health care provider. Document Released: 10/11/2015 Document Revised: 06/03/2016 Document Reviewed: 07/16/2015 Elsevier Interactive Patient Education  2017 [employer].

## 2016-12-14 NOTE — Progress Notes (Signed)
Pre visit review using our clinic review tool, if applicable. No additional management support is needed unless otherwise documented below in the visit note. 

## 2016-12-14 NOTE — Assessment & Plan Note (Signed)
Pt takes xanax very rarely

## 2016-12-14 NOTE — Progress Notes (Signed)
Subjective:  I acted as a Education administrator for Dr. Royden Purl, LPN    Patient ID: Kathy Watkins, female    DOB: [birth year], 56 y.o.   MRN: [id number]  Chief Complaint  Patient presents with  . Annual Exam    fasting.  Marland Kitchen Hypertension    follow up  . Hyperlipidemia    follow up    Hypertension  This is a chronic problem. The current episode started in the past 7 days. The problem is controlled. Pertinent negatives include no blurred vision, chest pain, headaches or palpitations.  Hyperlipidemia  This is a chronic problem. The current episode started more than 1 year ago. The problem is controlled. Pertinent negatives include no chest pain.    Patient is in today for annual physical, follow up hypertension, and hyperlipidemia. Patient have no concerns at this time.   Patient Care Team: Ann Held, DO as PCP - General Melina Schools, MD as Consulting Physician (Orthopedic Surgery) Marylynn Pearson, MD as Consulting Physician (Obstetrics and Gynecology)   Past Medical History:  Diagnosis Date  . Anxiety   . Atypical ductal hyperplasia of breast   . Breast cancer ([place])    "right only" (10/30/2013)  . Colon polyps   . Hyperlipidemia   . Hypertension   . Lobular carcinoma in situ    Hx: of 2015  . PONV (postoperative nausea and vomiting)     Past Surgical History:  Procedure Laterality Date  . ABDOMINAL HYSTERECTOMY  1994   adenomyosis  . BREAST BIOPSY Left 2008  . BREAST BIOPSY Right   . BREAST LUMPECTOMY Left 2008    ADH  . BREAST RECONSTRUCTION WITH PLACEMENT OF TISSUE EXPANDER AND FLEX HD (ACELLULAR HYDRATED DERMIS) Bilateral 10/30/2013  . BREAST RECONSTRUCTION WITH PLACEMENT OF TISSUE EXPANDER AND FLEX HD (ACELLULAR HYDRATED DERMIS) Bilateral 10/30/2013   Procedure: BILATERAL BREAST RECONSTRUCTION WITH PLACEMENT OF TISSUE EXPANDER AND POSSIBLE USE OF FLEX HD (ACELLULAR HYDRATED DERMIS);  Surgeon: Crissie Reese, MD;  Location: [facility];  Service: Plastics;  Laterality:  Bilateral;  . CERVICAL DISC ARTHROPLASTY N/A 06/06/2015   Procedure: Cervical 5-Cervical 6 TOTAL DISC ARTHROPLASTY;  Surgeon: Melina Schools, MD;  Location: [facility];  Service: Orthopedics;  Laterality: N/A;  . COLONOSCOPY W/ BIOPSIES AND POLYPECTOMY     Hx:of  . DILATION AND CURETTAGE OF UTERUS    . MASTECTOMY Bilateral 10/30/2013  . MASTECTOMY, MODIFIED RADICAL W/RECONSTRUCTION Bilateral 10/30/2013   Procedure: BILATERAL MASTECTOMY ;  Surgeon: Merrie Roof, MD;  Location: [facility];  Service: General;  Laterality: Bilateral;  Bilateral   . PAROTID GLAND TUMOR EXCISION Left 2000  . TUBAL LIGATION  1989    Family History  Problem Relation Age of Onset  . Hypertension Mother   . Breast cancer Mother 36  . Kidney disease Father   . Hypertension Father   . Hyperlipidemia Father   . Prostate cancer Father 48  . Hypertension Sister   . Hyperlipidemia Sister   . Colon polyps Sister   . Heart attack Maternal Uncle   . Melanoma Paternal Aunt 55    on leg  . Lung cancer Maternal Grandmother     smoker  . Colon cancer Paternal Grandmother 65  . Stroke Paternal Grandfather 2    Social History   Social History  . Marital status: Married    Spouse name: N/A  . Number of children: 2  . Years of education: N/A   Occupational History  . [employer]   Social History Main Topics  . Smoking status: Never Smoker  . Smokeless tobacco: Never Used  . Alcohol use 1.2 oz/week    2 Glasses of wine per week  . Drug use: No  . Sexual activity: Yes    Partners: Male   Other Topics Concern  . Not on file   Social History Narrative   Exercise-- walks 5 nights a week 3 miles    Outpatient Medications Prior to Visit  Medication Sig Dispense Refill  . ALPRAZolam (XANAX) 0.25 MG tablet Take 1 tablet (0.25 mg total) by mouth 3 (three) times daily as needed for anxiety. 30 tablet 0  . amLODipine (NORVASC) 10 MG tablet TAKE 1 TABLET BY MOUTH EVERY DAY 90 tablet 0  .  rosuvastatin (CRESTOR) 10 MG tablet Take 1 tablet (10 mg total) by mouth daily. 90 tablet 1   No facility-administered medications prior to visit.     No Known Allergies  Review of Systems  Constitutional: Negative for fever.  HENT: Negative for congestion.   Eyes: Negative for blurred vision.  Respiratory: Negative for cough.   Cardiovascular: Negative for chest pain and palpitations.  Gastrointestinal: Negative for vomiting.  Musculoskeletal: Negative for back pain.  Skin: Negative for rash.  Neurological: Negative for loss of consciousness and headaches.       Objective:    Physical Exam  Constitutional: She is oriented to person, place, and time. She appears well-developed and well-nourished. No distress.  HENT:  Head: Normocephalic and atraumatic.  Eyes: Conjunctivae are normal. Pupils are equal, round, and reactive to light.  Neck: Normal range of motion. No thyromegaly present.  Cardiovascular: Normal rate and regular rhythm.   Pulmonary/Chest: Effort normal and breath sounds normal. She has no wheezes.  Abdominal: Soft. Bowel sounds are normal. There is no tenderness.  Genitourinary:  Genitourinary Comments: Per gyn  Musculoskeletal: Normal range of motion. She exhibits no edema or deformity.  Neurological: She is alert and oriented to person, place, and time.  Skin: Skin is warm and dry. She is not diaphoretic.  Psychiatric: She has a normal mood and affect.  Nursing note and vitals reviewed.   BP 102/82 (BP Location: Left Arm, Patient Position: Sitting, Cuff Size: Normal)   Pulse 77   Temp 98.3 F (36.8 C) (Oral)   Resp 16   Ht 5\' 5"  (1.651 m)   Wt 150 lb (68 kg)   SpO2 97%   BMI 24.96 kg/m  Wt Readings from Last 3 Encounters:  12/14/16 150 lb (68 kg)  05/22/16 148 lb 3.2 oz (67.2 kg)  11/19/15 168 lb (76.2 kg)   BP Readings from Last 3 Encounters:  12/14/16 102/82  05/22/16 122/88  11/19/15 126/84     Immunization History  Administered Date(s)  Administered  . Influenza Split 09/01/2011, 07/27/2012  . Influenza Whole 07/03/2008, 07/04/2009, 06/13/2010  . Influenza,inj,Quad PF,36+ Mos 09/08/2013, 06/07/2014, 09/20/2015, 07/27/2016  . Pneumococcal Polysaccharide-23 11/08/2014  . Td 11/07/2007    Health Maintenance  Topic Date Due  . PAP SMEAR  05/30/2017  . TETANUS/TDAP  11/06/2017  . COLONOSCOPY  08/28/2021  . INFLUENZA VACCINE  Completed  . Hepatitis C Screening  Completed  . HIV Screening  Completed    Lab Results  Component Value Date   WBC 8.5 06/04/2015   HGB 14.7 06/04/2015   HCT 44.6 06/04/2015   PLT 289 06/04/2015   GLUCOSE 91 05/22/2016   CHOL 137 05/22/2016   TRIG 53.0 05/22/2016  HDL 55.90 05/22/2016   LDLDIRECT 151.0 09/11/2013   LDLCALC 70 05/22/2016   ALT 26 05/22/2016   AST 22 05/22/2016   NA 140 05/22/2016   K 4.1 05/22/2016   CL 105 05/22/2016   CREATININE 0.86 05/22/2016   BUN 11 05/22/2016   CO2 30 05/22/2016   TSH 1.91 09/11/2013    Lab Results  Component Value Date   TSH 1.91 09/11/2013   Lab Results  Component Value Date   WBC 8.5 06/04/2015   HGB 14.7 06/04/2015   HCT 44.6 06/04/2015   MCV 89.4 06/04/2015   PLT 289 06/04/2015   Lab Results  Component Value Date   NA 140 05/22/2016   K 4.1 05/22/2016   CO2 30 05/22/2016   GLUCOSE 91 05/22/2016   BUN 11 05/22/2016   CREATININE 0.86 05/22/2016   BILITOT 0.5 05/22/2016   ALKPHOS 79 05/22/2016   AST 22 05/22/2016   ALT 26 05/22/2016   PROT 7.3 05/22/2016   ALBUMIN 4.3 05/22/2016   CALCIUM 9.4 05/22/2016   ANIONGAP 8 06/04/2015   GFR 72.46 05/22/2016   Lab Results  Component Value Date   CHOL 137 05/22/2016   Lab Results  Component Value Date   HDL 55.90 05/22/2016   Lab Results  Component Value Date   LDLCALC 70 05/22/2016   Lab Results  Component Value Date   TRIG 53.0 05/22/2016   Lab Results  Component Value Date   CHOLHDL 2 05/22/2016   No results found for: HGBA1C       Assessment & Plan:    Problem List Items Addressed This Visit      Unprioritized   Hyperlipidemia   Relevant Orders   Lipid panel   POCT urinalysis dipstick   Anxiety    Pt takes xanax very rarely      Breast cancer, stage 0    Per oncology      Essential hypertension    Well controlled, no changes to meds. Encouraged heart healthy diet such as the DASH diet and exercise as tolerated.       Relevant Orders   CBC   Comprehensive metabolic panel   POCT urinalysis dipstick   Preventative health care - Primary    ghm utd Check labs See AVS       Relevant Orders   TSH   POCT urinalysis dipstick   Fecal occult blood, imunochemical      I am having Ms. Alan maintain her rosuvastatin, amLODipine, and ALPRAZolam.  No orders of the defined types were placed in this encounter.   CMA served as Education administrator during this visit. History, Physical and Plan performed by medical provider. Documentation and orders reviewed and attested to.  Ann Held, DO   Patient ID: Tais Koestner, female   DOB: 10-Jan-1961, 56 y.o.   MRN: [id number]

## 2016-12-14 NOTE — Assessment & Plan Note (Signed)
Per oncology °

## 2016-12-15 ENCOUNTER — Other Ambulatory Visit: Payer: Self-pay | Admitting: Family Medicine

## 2016-12-15 ENCOUNTER — Encounter: Payer: Self-pay | Admitting: *Deleted

## 2016-12-15 DIAGNOSIS — E785 Hyperlipidemia, unspecified: Secondary | ICD-10-CM

## 2016-12-16 ENCOUNTER — Encounter: Payer: Self-pay | Admitting: Family Medicine

## 2017-01-08 ENCOUNTER — Telehealth: Payer: Self-pay | Admitting: *Deleted

## 2017-01-08 NOTE — Telephone Encounter (Signed)
Per ins they will covered Shingrix.  Patient notified and appointment made 01/14/17 for 1st injection.

## 2017-01-14 ENCOUNTER — Ambulatory Visit: Payer: 59

## 2017-01-18 ENCOUNTER — Other Ambulatory Visit: Payer: Self-pay | Admitting: Family Medicine

## 2017-01-18 NOTE — Telephone Encounter (Signed)
Requesting:   alprazolam Contract   none UDS   none Last OV    12/14/2016----06/17/2017 is future appt Last Refill    #30 no refills on 12/08/2016    Please Advise

## 2017-01-18 NOTE — Telephone Encounter (Signed)
Faxed hardcopy for Alprazolam to [facility]

## 2017-01-26 ENCOUNTER — Encounter: Payer: Self-pay | Admitting: Family Medicine

## 2017-01-26 NOTE — Telephone Encounter (Signed)
Only if you do not have a fever

## 2017-01-27 ENCOUNTER — Ambulatory Visit: Payer: 59

## 2017-02-02 ENCOUNTER — Other Ambulatory Visit: Payer: Self-pay | Admitting: Obstetrics and Gynecology

## 2017-02-02 DIAGNOSIS — D0502 Lobular carcinoma in situ of left breast: Principal | ICD-10-CM

## 2017-02-02 DIAGNOSIS — D0501 Lobular carcinoma in situ of right breast: Secondary | ICD-10-CM

## 2017-02-04 ENCOUNTER — Ambulatory Visit
Admission: RE | Admit: 2017-02-04 | Discharge: 2017-02-04 | Disposition: A | Discharge status: Home / Self Care | Payer: 59 | Source: Ambulatory Visit | Attending: Obstetrics and Gynecology | Admitting: Obstetrics and Gynecology

## 2017-02-04 DIAGNOSIS — D0501 Lobular carcinoma in situ of right breast: Secondary | ICD-10-CM

## 2017-02-04 DIAGNOSIS — D0502 Lobular carcinoma in situ of left breast: Principal | ICD-10-CM

## 2017-02-04 MED ORDER — GADOBENATE DIMEGLUMINE 529 MG/ML IV SOLN
12.0000 mL | Freq: Once | INTRAVENOUS | Status: AC | PRN
  Administered 2017-02-04: 12 mL via INTRAVENOUS

## 2017-02-15 ENCOUNTER — Other Ambulatory Visit: Payer: Self-pay | Admitting: Family Medicine

## 2017-02-15 NOTE — Telephone Encounter (Signed)
Faxed hardcopy to [facility]  

## 2017-02-15 NOTE — Telephone Encounter (Signed)
Requesting:   alprazolam Contract    none UDS    none Last OV     12/14/2016----future appt is on 06/17/2017 Last Refill     #30 no refills on 01/18/2017  Please Advise

## 2017-03-24 ENCOUNTER — Other Ambulatory Visit: Payer: Self-pay | Admitting: Family Medicine

## 2017-03-25 NOTE — Telephone Encounter (Signed)
Pt is requesting refill on alprazolam 0.25mg   Last OV: 12/14/2016 Last Fill: 02/15/2017 #30 and 0RF Pt sig: 1 tab tid prn UDS: None  Please advise.

## 2017-03-25 NOTE — Telephone Encounter (Signed)
Rx faxed to [facility] pharmacy.  

## 2017-03-25 NOTE — Telephone Encounter (Signed)
Refill x1 

## 2017-03-25 NOTE — Telephone Encounter (Signed)
Rx printed, awaiting DO signature.  

## 2017-04-03 ENCOUNTER — Encounter: Payer: Self-pay | Admitting: Family Medicine

## 2017-06-17 ENCOUNTER — Ambulatory Visit: Payer: 59 | Admitting: Family Medicine

## 2017-07-15 IMAGING — DX DG LUMBAR SPINE COMPLETE 4+V
5 series · 5 of 5 positions shown · non-contrast
Comparison: 08/30/2014

CLINICAL DATA: Low back pain.  Breast cancer

EXAM:
LUMBAR SPINE - COMPLETE 4+ VIEW

[l-spine ap]
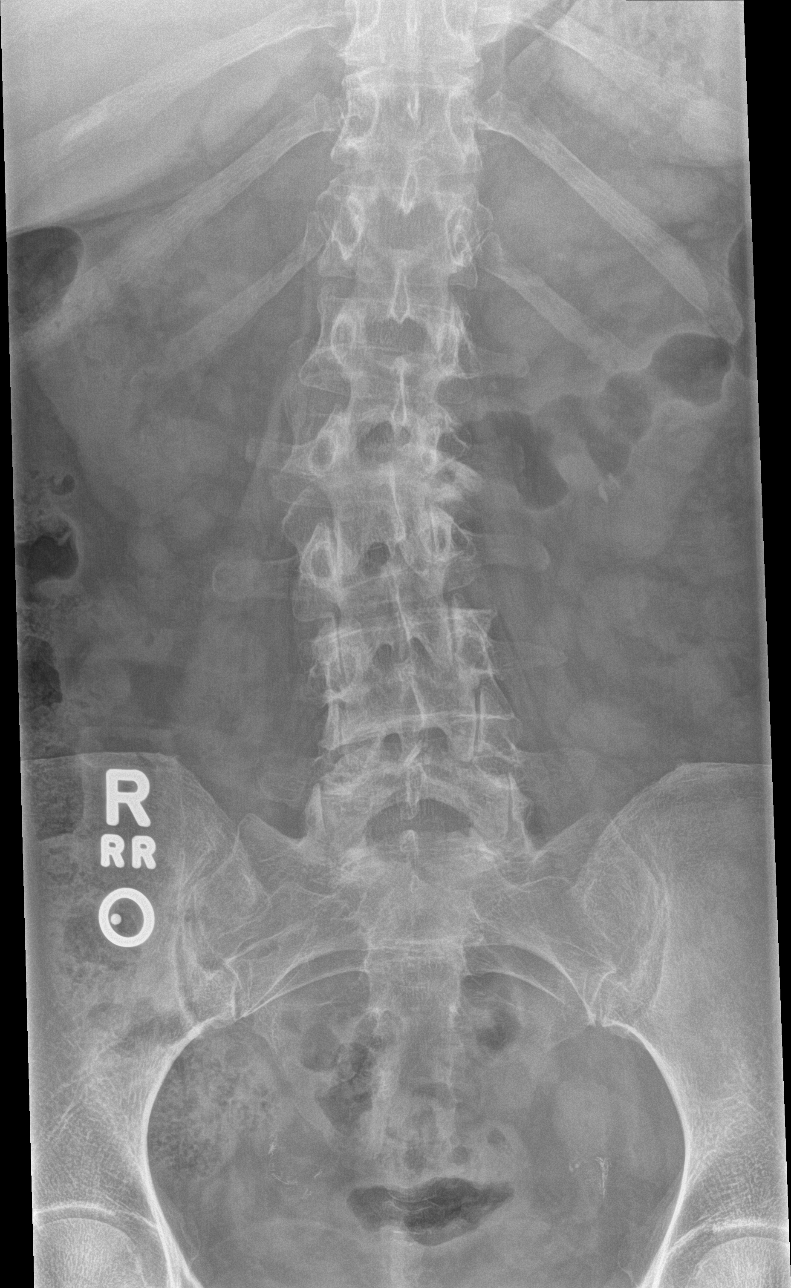

[l-spine obl (1 of 2)]
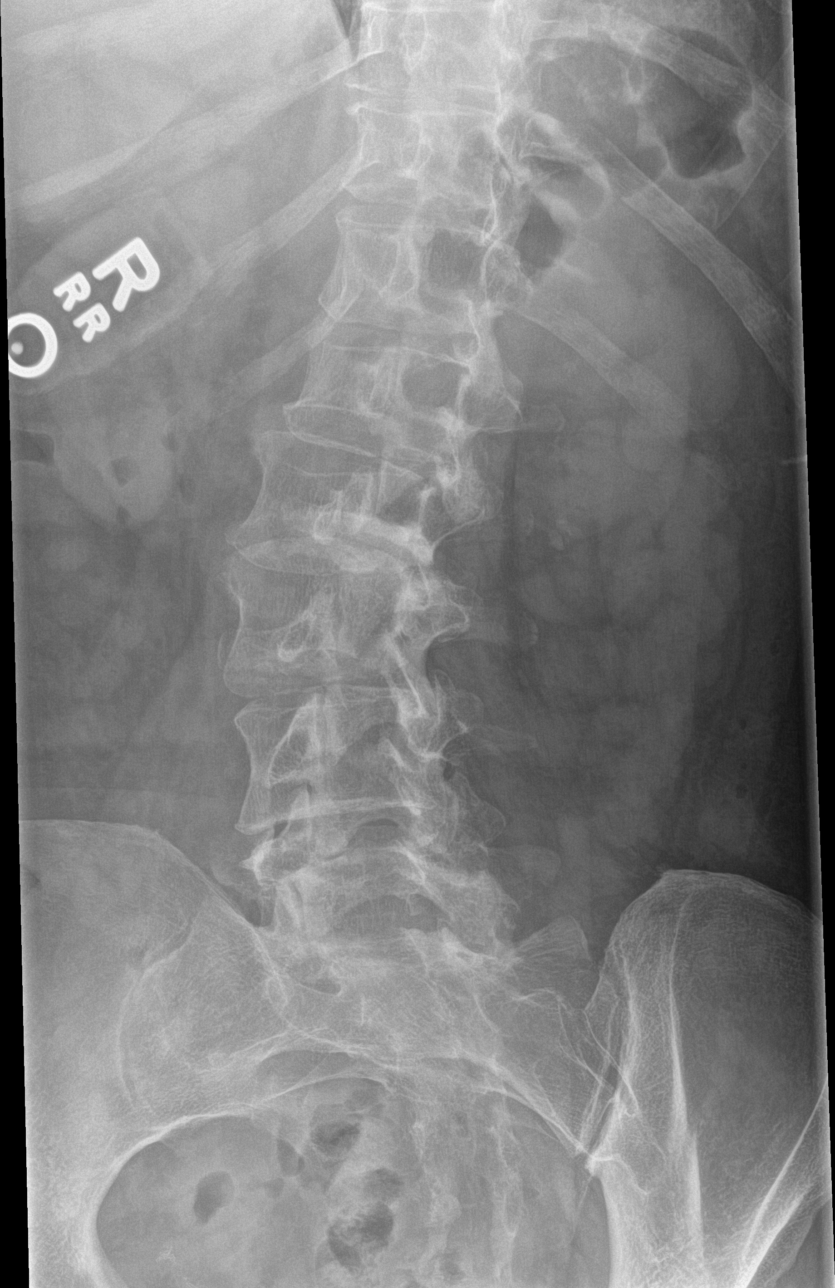

[l-spine obl (2 of 2)]
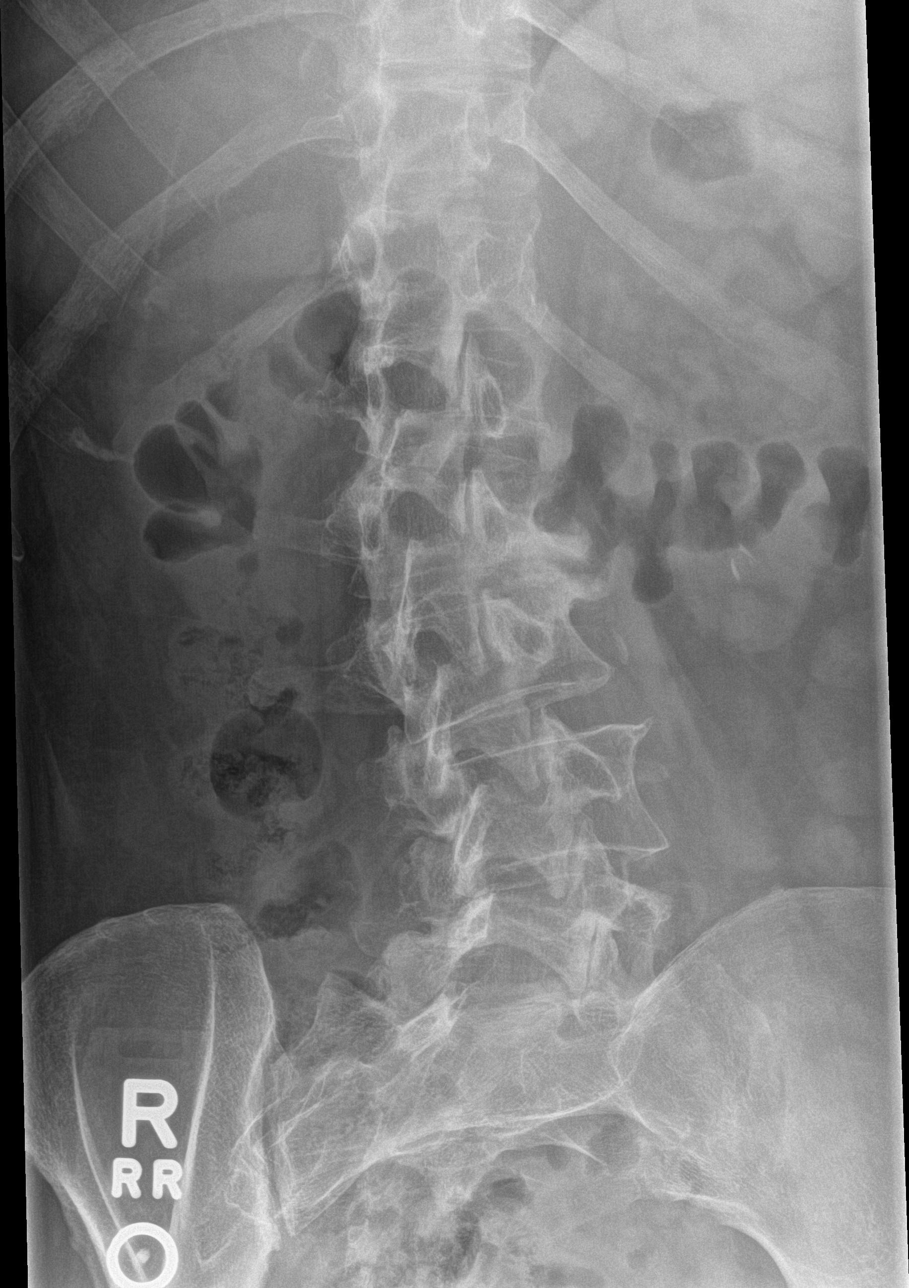

[l-spine lat]
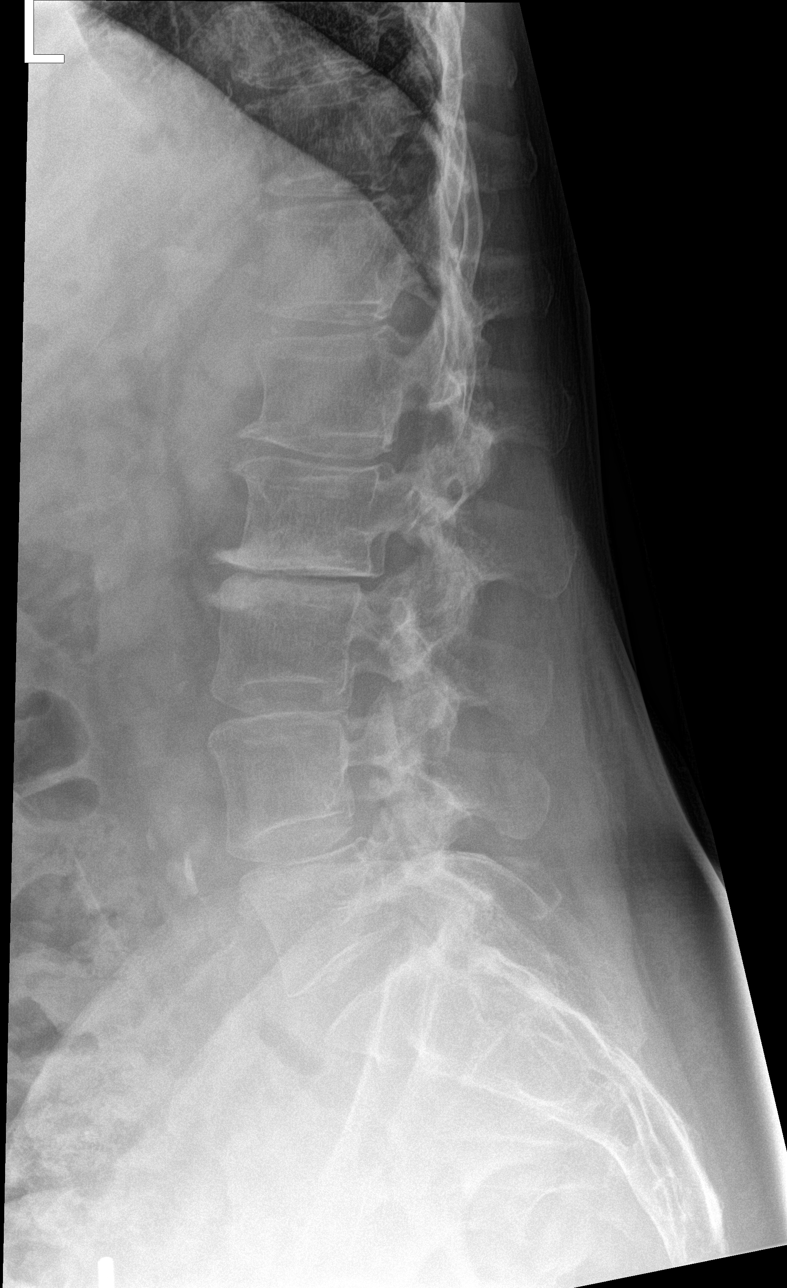

[l-spine spot]
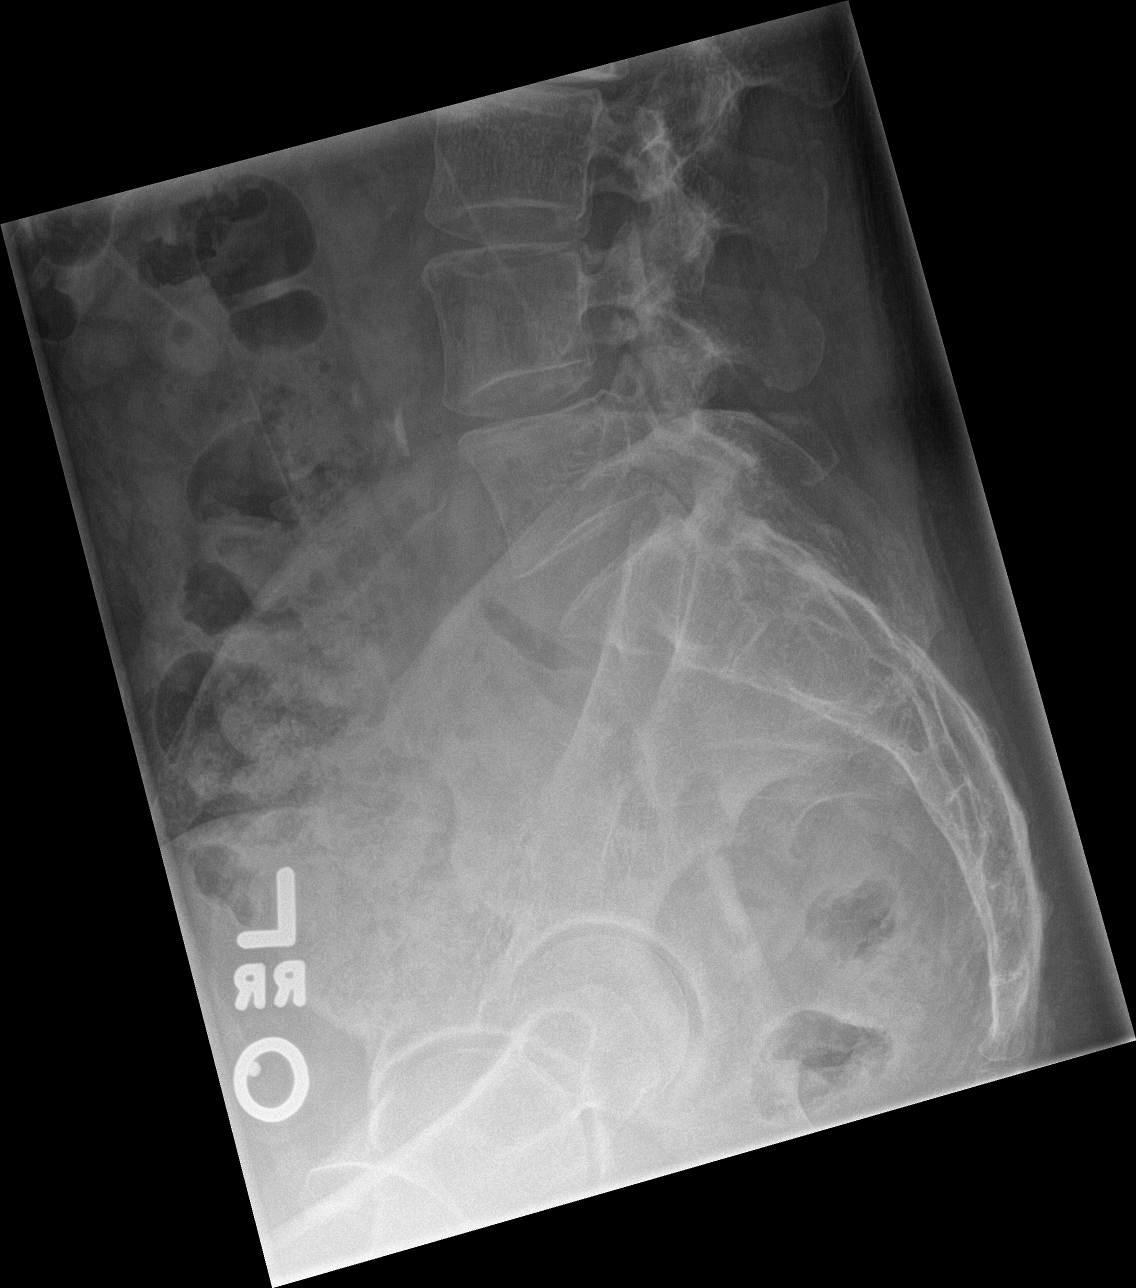

[5 of 5 positions shown; findings below may reference images not displayed]

FINDINGS: Mild to moderate dextroscoliosis at L2-3, slightly increased from
the prior study. Negative for fracture or mass. Negative for pars
defect.

Moderate disc degeneration on the left at L2-3 with disc space
narrowing, sclerosis, and spurring. Mild disc degeneration L1-2.
Normal alignment.

Left lower pole renal calculi unchanged.
IMPRESSION: Lumbar dextroscoliosis with prominent disc degeneration on the right
at L2-3. No acute bony change.

Left renal calculi.

## 2017-12-16 ENCOUNTER — Encounter: Payer: 59 | Admitting: Family Medicine

## 2018-02-26 ENCOUNTER — Other Ambulatory Visit: Payer: Self-pay | Admitting: Family Medicine

## 2020-04-29 ENCOUNTER — Other Ambulatory Visit: Payer: Self-pay | Admitting: General Surgery

## 2020-04-29 DIAGNOSIS — Z1239 Encounter for other screening for malignant neoplasm of breast: Secondary | ICD-10-CM

## 2020-05-28 ENCOUNTER — Ambulatory Visit
Admission: RE | Admit: 2020-05-28 | Discharge: 2020-05-28 | Disposition: A | Discharge status: Home / Self Care | Payer: 59 | Source: Ambulatory Visit | Attending: General Surgery | Admitting: General Surgery

## 2020-05-28 ENCOUNTER — Other Ambulatory Visit: Payer: Self-pay

## 2020-05-28 DIAGNOSIS — Z1239 Encounter for other screening for malignant neoplasm of breast: Secondary | ICD-10-CM

## 2020-05-28 MED ORDER — GADOBUTROL 1 MMOL/ML IV SOLN
8.0000 mL | Freq: Once | INTRAVENOUS | Status: AC | PRN
  Administered 2020-05-28: 8 mL via INTRAVENOUS
